# Patient Record
Sex: Female | Born: 1935 | Race: White | Hispanic: No | Smoking: Former smoker
Health system: Southern US, Community
[De-identification: ages and names within clinical notes are randomized; demographics above are authoritative.]

## PROBLEM LIST (undated history)

## (undated) DIAGNOSIS — M1990 Unspecified osteoarthritis, unspecified site: Secondary | ICD-10-CM

## (undated) DIAGNOSIS — J45909 Unspecified asthma, uncomplicated: Secondary | ICD-10-CM

## (undated) DIAGNOSIS — J449 Chronic obstructive pulmonary disease, unspecified: Secondary | ICD-10-CM

## (undated) DIAGNOSIS — N289 Disorder of kidney and ureter, unspecified: Secondary | ICD-10-CM

## (undated) HISTORY — PX: BLADDER SURGERY: SHX569

## (undated) HISTORY — PX: REVISION UROSTOMY CUTANEOUS: SUR1282

## (undated) HISTORY — PX: ABDOMINAL HYSTERECTOMY: SHX81

---

## 2012-10-21 ENCOUNTER — Inpatient Hospital Stay (HOSPITAL_COMMUNITY)
Admission: EM | Admit: 2012-10-21 | Discharge: 2012-10-26 | DRG: 085 | Disposition: A | Discharge status: Rehabilitation Facility | Payer: Medicare Other | Attending: Internal Medicine | Admitting: Internal Medicine

## 2012-10-21 ENCOUNTER — Emergency Department (HOSPITAL_COMMUNITY): Payer: Medicare Other

## 2012-10-21 ENCOUNTER — Encounter (HOSPITAL_COMMUNITY): Payer: Self-pay | Admitting: *Deleted

## 2012-10-21 DIAGNOSIS — Z9181 History of falling: Secondary | ICD-10-CM

## 2012-10-21 DIAGNOSIS — K449 Diaphragmatic hernia without obstruction or gangrene: Secondary | ICD-10-CM | POA: Diagnosis present

## 2012-10-21 DIAGNOSIS — M129 Arthropathy, unspecified: Secondary | ICD-10-CM | POA: Diagnosis present

## 2012-10-21 DIAGNOSIS — IMO0002 Reserved for concepts with insufficient information to code with codable children: Secondary | ICD-10-CM | POA: Diagnosis present

## 2012-10-21 DIAGNOSIS — Q438 Other specified congenital malformations of intestine: Secondary | ICD-10-CM

## 2012-10-21 DIAGNOSIS — Z87891 Personal history of nicotine dependence: Secondary | ICD-10-CM

## 2012-10-21 DIAGNOSIS — G936 Cerebral edema: Secondary | ICD-10-CM

## 2012-10-21 DIAGNOSIS — R634 Abnormal weight loss: Secondary | ICD-10-CM | POA: Diagnosis present

## 2012-10-21 DIAGNOSIS — G9341 Metabolic encephalopathy: Secondary | ICD-10-CM | POA: Diagnosis present

## 2012-10-21 DIAGNOSIS — E871 Hypo-osmolality and hyponatremia: Secondary | ICD-10-CM | POA: Diagnosis present

## 2012-10-21 DIAGNOSIS — R933 Abnormal findings on diagnostic imaging of other parts of digestive tract: Secondary | ICD-10-CM

## 2012-10-21 DIAGNOSIS — W19XXXA Unspecified fall, initial encounter: Secondary | ICD-10-CM | POA: Diagnosis present

## 2012-10-21 DIAGNOSIS — R42 Dizziness and giddiness: Secondary | ICD-10-CM | POA: Diagnosis present

## 2012-10-21 DIAGNOSIS — R948 Abnormal results of function studies of other organs and systems: Secondary | ICD-10-CM | POA: Diagnosis present

## 2012-10-21 DIAGNOSIS — K2100 Gastro-esophageal reflux disease with esophagitis, without bleeding: Secondary | ICD-10-CM | POA: Diagnosis present

## 2012-10-21 DIAGNOSIS — S06369A Traumatic hemorrhage of cerebrum, unspecified, with loss of consciousness of unspecified duration, initial encounter: Secondary | ICD-10-CM

## 2012-10-21 DIAGNOSIS — N35919 Unspecified urethral stricture, male, unspecified site: Secondary | ICD-10-CM | POA: Diagnosis present

## 2012-10-21 DIAGNOSIS — Z936 Other artificial openings of urinary tract status: Secondary | ICD-10-CM

## 2012-10-21 DIAGNOSIS — R269 Unspecified abnormalities of gait and mobility: Secondary | ICD-10-CM | POA: Diagnosis present

## 2012-10-21 DIAGNOSIS — N390 Urinary tract infection, site not specified: Secondary | ICD-10-CM | POA: Diagnosis present

## 2012-10-21 DIAGNOSIS — S065X0A Traumatic subdural hemorrhage without loss of consciousness, initial encounter: Principal | ICD-10-CM | POA: Diagnosis present

## 2012-10-21 DIAGNOSIS — D509 Iron deficiency anemia, unspecified: Secondary | ICD-10-CM | POA: Diagnosis present

## 2012-10-21 DIAGNOSIS — I619 Nontraumatic intracerebral hemorrhage, unspecified: Secondary | ICD-10-CM

## 2012-10-21 DIAGNOSIS — R296 Repeated falls: Secondary | ICD-10-CM

## 2012-10-21 DIAGNOSIS — J4489 Other specified chronic obstructive pulmonary disease: Secondary | ICD-10-CM | POA: Diagnosis present

## 2012-10-21 DIAGNOSIS — G2581 Restless legs syndrome: Secondary | ICD-10-CM

## 2012-10-21 DIAGNOSIS — K319 Disease of stomach and duodenum, unspecified: Secondary | ICD-10-CM

## 2012-10-21 DIAGNOSIS — J449 Chronic obstructive pulmonary disease, unspecified: Secondary | ICD-10-CM | POA: Diagnosis present

## 2012-10-21 DIAGNOSIS — Z79899 Other long term (current) drug therapy: Secondary | ICD-10-CM

## 2012-10-21 HISTORY — DX: Chronic obstructive pulmonary disease, unspecified: J44.9

## 2012-10-21 HISTORY — DX: Unspecified asthma, uncomplicated: J45.909

## 2012-10-21 HISTORY — DX: Unspecified osteoarthritis, unspecified site: M19.90

## 2012-10-21 HISTORY — DX: Disorder of kidney and ureter, unspecified: N28.9

## 2012-10-21 LAB — URINALYSIS, ROUTINE W REFLEX MICROSCOPIC
Bilirubin Urine: NEGATIVE
Glucose, UA: NEGATIVE mg/dL
Ketones, ur: NEGATIVE mg/dL
Specific Gravity, Urine: 1.012 (ref 1.005–1.030)
pH: 6.5 (ref 5.0–8.0)

## 2012-10-21 LAB — COMPREHENSIVE METABOLIC PANEL
ALT: 16 U/L (ref 0–35)
AST: 20 U/L (ref 0–37)
Albumin: 3.3 g/dL — ABNORMAL LOW (ref 3.5–5.2)
Alkaline Phosphatase: 67 U/L (ref 39–117)
Calcium: 9.1 mg/dL (ref 8.4–10.5)
Glucose, Bld: 98 mg/dL (ref 70–99)
Potassium: 4.6 mEq/L (ref 3.5–5.1)
Sodium: 128 mEq/L — ABNORMAL LOW (ref 135–145)
Total Protein: 7 g/dL (ref 6.0–8.3)

## 2012-10-21 LAB — CBC WITH DIFFERENTIAL/PLATELET
Basophils Absolute: 0 10*3/uL (ref 0.0–0.1)
Eosinophils Absolute: 0.3 10*3/uL (ref 0.0–0.7)
Lymphs Abs: 1.7 10*3/uL (ref 0.7–4.0)
MCH: 25.9 pg — ABNORMAL LOW (ref 26.0–34.0)
Neutrophils Relative %: 59 % (ref 43–77)
Platelets: 316 10*3/uL (ref 150–400)
RBC: 4.17 MIL/uL (ref 3.87–5.11)
RDW: 17 % — ABNORMAL HIGH (ref 11.5–15.5)
WBC: 6.9 10*3/uL (ref 4.0–10.5)

## 2012-10-21 LAB — URINE MICROSCOPIC-ADD ON

## 2012-10-21 MED ORDER — DEXTROSE 5 % IV SOLN
1.0000 g | Freq: Once | INTRAVENOUS | Status: AC
  Administered 2012-10-21: 1 g via INTRAVENOUS
  Filled 2012-10-21: qty 10

## 2012-10-21 MED ORDER — GADOBENATE DIMEGLUMINE 529 MG/ML IV SOLN
10.0000 mL | Freq: Once | INTRAVENOUS | Status: AC | PRN
  Administered 2012-10-21: 10 mL via INTRAVENOUS

## 2012-10-21 MED ORDER — SENNOSIDES-DOCUSATE SODIUM 8.6-50 MG PO TABS
1.0000 | ORAL_TABLET | Freq: Two times a day (BID) | ORAL | Status: DC
  Administered 2012-10-21 – 2012-10-26 (×7): 1 via ORAL
  Filled 2012-10-21 (×9): qty 1

## 2012-10-21 MED ORDER — ONDANSETRON HCL 4 MG/2ML IJ SOLN: 4.0000 mg | Freq: Four times a day (QID) | INTRAMUSCULAR | Status: DC | PRN

## 2012-10-21 MED ORDER — PANTOPRAZOLE SODIUM 40 MG IV SOLR
40.0000 mg | Freq: Every day | INTRAVENOUS | Status: DC
  Administered 2012-10-21 – 2012-10-22 (×2): 40 mg via INTRAVENOUS
  Filled 2012-10-21 (×3): qty 40

## 2012-10-21 MED ORDER — ACETAMINOPHEN 325 MG PO TABS
650.0000 mg | ORAL_TABLET | ORAL | Status: DC | PRN
  Administered 2012-10-22 – 2012-10-26 (×8): 650 mg via ORAL
  Filled 2012-10-21 (×8): qty 2

## 2012-10-21 MED ORDER — ACETAMINOPHEN 650 MG RE SUPP: 650.0000 mg | RECTAL | Status: DC | PRN

## 2012-10-21 MED ORDER — SODIUM CHLORIDE 0.9 % IJ SOLN
INTRAMUSCULAR | Status: AC
  Administered 2012-10-22
  Filled 2012-10-21: qty 10

## 2012-10-21 MED ORDER — LABETALOL HCL 5 MG/ML IV SOLN: 10.0000 mg | INTRAVENOUS | Status: DC | PRN

## 2012-10-21 MED ORDER — SODIUM CHLORIDE 0.9 % IV SOLN
INTRAVENOUS | Status: AC
  Administered 2012-10-21: 23:00:00 via INTRAVENOUS

## 2012-10-21 MED ORDER — SODIUM CHLORIDE 0.9 % IV BOLUS (SEPSIS)
1000.0000 mL | Freq: Once | INTRAVENOUS | Status: AC
  Administered 2012-10-21: 1000 mL via INTRAVENOUS

## 2012-10-21 NOTE — ED Notes (Signed)
Report given to [facility], ETA 10 min.  

## 2012-10-21 NOTE — H&P (Signed)
Admission H&P    Chief Complaint: Dizziness and difficulty with gait HPI: Nicole Sparks is an 79 y.o. female who has not been doing well for the past 3 weeks or so.  The patient was diagnosed with a urinary tract infection and has had poor balance for that time.  She has been through multiple antibiotic regimens.  Due to her poor balance during this period of time she has had multiple falls.  Today felt that she was dizzy and had more difficulty with gait.  Called her daughter and was brought in for evaluation at that time.    Date last known well: Unable to determine Time last known well: Unable to determine tPA Given: No: ICH  Past Medical History  Diagnosis Date  . COPD (chronic obstructive pulmonary disease)   . Asthma   . Arthritis   . Renal disorder     Past Surgical History  Procedure Laterality Date  . Revision urostomy cutaneous    . Bladder surgery    . Abdominal hysterectomy      Family history: Mother died of a MI and a stroke.  She had splenic cancer as well.  Father died of lung cancer.  She had two brothers.  One died of cancer and the other died in an airplane accident.    Social History:  reports that she quit smoking about 24 years ago. She has never used smokeless tobacco. She reports that  drinks alcohol. She reports that she does not use illicit drugs.  Allergies:  Allergies  Allergen Reactions  . Sudafed (Pseudoephedrine)     Passes out    Medications Prior to Admission  Medication Sig Dispense Refill  . albuterol (PROVENTIL) (2.5 MG/3ML) 0.083% nebulizer solution Take 2.5 mg by nebulization every 4 (four) hours as needed for wheezing or shortness of breath.      . ALPRAZolam (XANAX) 0.5 MG tablet Take 0.5 mg by mouth at bedtime.      . budesonide-formoterol (SYMBICORT) 80-4.5 MCG/ACT inhaler Inhale 2 puffs into the lungs 2 (two) times daily as needed (for shortnes of breath).      . calcium-vitamin D (OSCAL WITH D) 500-200 MG-UNIT per tablet Take 1  tablet by mouth 2 (two) times daily.      . cetirizine (ZYRTEC) 10 MG tablet Take 10 mg by mouth daily as needed for allergies.      . citalopram (CELEXA) 40 MG tablet Take 40 mg by mouth daily.      . Menthol, Topical Analgesic, (BENGAY EX) Apply 1 application topically at bedtime as needed (for knee/back pain).      . Multiple Vitamin (MULTIVITAMIN WITH MINERALS) TABS Take 1 tablet by mouth daily.      . nitrofurantoin (MACRODANTIN) 100 MG capsule Take 100 mg by mouth 2 (two) times daily. Taking until 10/23/12, then taper down to 1 cap daily.      Marland Kitchen omeprazole (PRILOSEC) 20 MG capsule Take 20 mg by mouth daily as needed (for heartburn).      . traMADol (ULTRAM) 50 MG tablet Take 50 mg by mouth every 4 (four) hours as needed for pain.        ROS: History obtained from the patient  General ROS: negative for - chills, fatigue, fever, night sweats, weight gain or weight loss Psychological ROS: memory difficulties Ophthalmic ROS: negative for - blurry vision, double vision, eye pain or loss of vision ENT ROS: negative for - epistaxis, nasal discharge, oral lesions, sore throat, tinnitus or vertigo  Allergy and Immunology ROS: negative for - hives or itchy/watery eyes Hematological and Lymphatic ROS: negative for - bleeding problems, bruising or swollen lymph nodes Endocrine ROS: negative for - galactorrhea, hair pattern changes, polydipsia/polyuria or temperature intolerance Respiratory ROS: negative for - cough, hemoptysis, shortness of breath or wheezing Cardiovascular ROS: negative for - chest pain, dyspnea on exertion, edema or irregular heartbeat Gastrointestinal ROS: negative for - abdominal pain, diarrhea, hematemesis, nausea/vomiting or stool incontinence Genito-Urinary ROS: urostomy tube in place Musculoskeletal ROS: negative for - joint swelling or muscular weakness Neurological ROS: as noted in HPI Dermatological ROS: negative for rash and skin lesion changes  Physical  Examination: Blood pressure 156/75, pulse 74, temperature 98.6 F (37 C), temperature source Oral, resp. rate 20, height 5\' 2"  (1.575 m), weight 53.524 kg (118 lb), SpO2 100.00%.  General Examination: HEENT-  Normocephalic, no lesions, without obvious abnormality.  Normal external eye and conjunctiva.  Normal TM's bilaterally.  Normal auditory canals and external ears. Normal external nose, mucus membranes and septum.  Normal pharynx. Neck supple with no masses, nodes, nodules or enlargement. Cardiovascular - S1, S2 normal Lungs - chest clear, no wheezing, rales, normal symmetric air entry Abdomen - soft, non-tender; bowel sounds normal; no masses,  no organomegaly. Urostomy clean and without evidence of irritation or infection Extremities - no edema  Neurologic Examination: Mental Status: Alert, oriented, thought content appropriate.  Speech fluent without evidence of aphasia.  Able to follow 3 step commands without difficulty. Cranial Nerves: II: Discs flat bilaterally; Visual fields grossly normal, pupils equal, round, reactive to light and accommodation III,IV, VI: ptosis not present, extra-ocular motions intact bilaterally V,VII: smile symmetric, facial light touch sensation normal bilaterally VIII: hearing normal bilaterally IX,X: gag reflex present XI: bilateral shoulder shrug XII: midline tongue extension Motor: Right : Upper extremity   5/5    Left:     Upper extremity   5/5  Lower extremity   5/5     Lower extremity   5/5 Tone and bulk:normal tone throughout; no atrophy noted.  Positive palmomental. Sensory: Pinprick and light touch intact throughout, bilaterally Deep Tendon Reflexes: 2+ and symmetric throughout Plantars: Right: upgoing   Left: upgoing Cerebellar: normal finger-to-nose and normal heel-to-shin test Gait: Unable to test CV: pulses palpable throughout   Laboratory Studies:   Basic Metabolic Panel:  Recent Labs Lab 10/21/12 1745  NA 128*  K 4.6  CL  94*  CO2 26  GLUCOSE 98  BUN 17  CREATININE 1.02  CALCIUM 9.1    Liver Function Tests:  Recent Labs Lab 10/21/12 1745  AST 20  ALT 16  ALKPHOS 67  BILITOT 0.1*  PROT 7.0  ALBUMIN 3.3*   No results found for this basename: LIPASE, AMYLASE,  in the last 168 hours No results found for this basename: AMMONIA,  in the last 168 hours  CBC:  Recent Labs Lab 10/21/12 1745  WBC 6.9  NEUTROABS 4.1  HGB 10.8*  HCT 33.9*  MCV 81.3  PLT 316    Cardiac Enzymes: No results found for this basename: CKTOTAL, CKMB, CKMBINDEX, TROPONINI,  in the last 168 hours  BNP: No components found with this basename: POCBNP,   CBG: No results found for this basename: GLUCAP,  in the last 168 hours  Microbiology: No results found for this or any previous visit.  Coagulation Studies: No results found for this basename: LABPROT, INR,  in the last 72 hours  Urinalysis:  Recent Labs Lab 10/21/12 1811  COLORURINE YELLOW  LABSPEC 1.012  PHURINE 6.5  GLUCOSEU NEGATIVE  HGBUR SMALL*  BILIRUBINUR NEGATIVE  KETONESUR NEGATIVE  PROTEINUR NEGATIVE  UROBILINOGEN 0.2  NITRITE POSITIVE*  LEUKOCYTESUR NEGATIVE    Lipid Panel:  No results found for this basename: chol, trig, hdl, cholhdl, vldl, ldlcalc    HgbA1C:  No results found for this basename: HGBA1C    Urine Drug Screen:   No results found for this basename: labopia, cocainscrnur, labbenz, amphetmu, thcu, labbarb    Alcohol Level: No results found for this basename: ETH,  in the last 168 hours  Imaging: Ct Head Wo Contrast  10/21/2012   *RADIOLOGY REPORT*  Clinical Data: Weakness.  UTI  CT HEAD WITHOUT CONTRAST  Technique:  Contiguous axial images were obtained from the base of the skull through the vertex without contrast.  Comparison: None  Findings: Generalized atrophy with prominent ventricles and subarachnoid space.  Chronic microvascular ischemic changes in the white matter.  15 mm lesion in the high right parietal lobe  is mildly hyper dense compared to brain.  There is surrounding vasogenic edema.  Negative for subarachnoid or subdural hemorrhage.  No shift to the midline structures.  IMPRESSION: Hyperdense 15 mm lesion right high parietal lobe with surrounding edema.  This may represent a metastatic disease containing acute hemorrhage.  This could be a hemorrhagic infarction or possibly an area resolving subacute hemorrhage.  Overall I would favor hemorrhagic metastatic disease.  Further evaluation with MRI brain without and with contrast is suggested.  I discussed the findings by telephone with Dr. Konrad Dolores   Original Report Authenticated By: Janeece Riggers, M.D.   Mr Laqueta Jean ZH Contrast  10/21/2012   *RADIOLOGY REPORT*  Clinical Data: Weakness.  Abnormal head CT.  MRI HEAD WITHOUT AND WITH CONTRAST  Technique:  Multiplanar, multiecho pulse sequences of the brain and surrounding structures were obtained according to standard protocol without and with intravenous contrast  Contrast: 10mL MULTIHANCE GADOBENATE DIMEGLUMINE 529 MG/ML IV SOLN  Comparison: Head CT same day  Findings: There is a 3.8 x 2.2 x 2.8 cm in diameter late subacute intraparenchymal hematoma in the medial right parietal lobe corresponding to the CT abnormality.  There is typical marginal enhancement.  There is some regional vasogenic edema.  There is a small amount of subdural blood along the right side of the posterior falx, no more than 1 mm in thickness.  There are chronic small vessel changes affecting the pons.  There is cerebellar atrophy but no focal cerebellar insult.  There are dilated perivascular spaces throughout the cerebral hemispheres. There are mild chronic small vessel changes within the deep white matter.  No evidence of neoplastic mass lesion.  No sign of acute hemorrhage.  No hydrocephalus.  No pituitary lesion.  No skull or skull base lesion.  IMPRESSION: The abnormality at CT represents a late subacute intraparenchymal hematoma.  There is  mild regional vasogenic edema but no mass effect of significance.  Very thin amount of subdural blood along the right posterior falx.  The findings could be post-traumatic or they could represent an intraparenchymal hemorrhage which penetrated into the subdural space with the underlying etiology been either hypertension or amyloid angiopathy.  Atrophy and chronic small vessel changes elsewhere throughout the brain.   Original Report Authenticated By: Paulina Fusi, M.D.    Assessment: 79 y.o. female presenting with difficulty with gait and dizziness that has increased over the past 3 weeks.  Head CT has been reviewed and shows a hyperdense area in the high  right parietal lobe.  MRI of the brain has been reviewed as well and shows the hemorrhage to be late subacute.  Nothing acute on imaging to explain patient's worsening today.  Although has been falling can not rule out other etiologies as well.    Stroke Risk Factors - none  Plan: 1. HgbA1c, fasting lipid panel 2. PT consult, OT consult 3. Echocardiogram 4. Carotid dopplers 5. Prophylactic therapy-None 6. Risk factor modification 7. Telemetry monitoring 8. Frequent neuro checks 9. On Rocephin for infection   Thana Farr, MD [facility] [phone] 10/21/2012, 11:19 PM

## 2012-10-21 NOTE — ED Provider Notes (Signed)
History    This chart was scribed for non-physician practitioner Junious Silk PA-C working with [name], MD by Smitty Pluck, ED scribe. This patient was seen in room WA18/WA18 and the patient's care was started at 4:58 PM.   CSN: [id number]  Arrival date & time 10/21/12  1621      Chief Complaint  Patient presents with  . Weakness  . Urinary Tract Infection     The history is provided by the patient, medical records and a relative. No language interpreter was used.   HPI Comments: Nicole Sparks is a 79 y.o. female with hx of asthma, COPD and renal disorder who presents to the Emergency Department complaining of moderate generalized weakness and light headedness that has been ongoing for past 3 months and worsening 3 weeks ago.  Family reports that pt was started on Macrodantin 5 days ago for UTI after no success using Cipro and Keflex. Family state that pt has persistent UTI over the past 3 weeks. Pt reports the light headedness is aggravated by moving her head. She reports having 4 falls within past 3 months. Her last fall was 3 weeks ago. Family states that she has some mild confusion that has gradually gotten worse over the past 3 weeks. Pt denies fever, chills, nausea, vomiting, diarrhea, weakness, cough, SOB and any other pain. Pt has urostomy.    Past Medical History  Diagnosis Date  . COPD (chronic obstructive pulmonary disease)   . Asthma   . Arthritis   . Renal disorder     Past Surgical History  Procedure Laterality Date  . Revision urostomy cutaneous    . Bladder surgery    . Abdominal hysterectomy      History reviewed. No pertinent family history.  History  Substance Use Topics  . Smoking status: Former Smoker    Quit date: 10/21/1988  . Smokeless tobacco: Never Used  . Alcohol Use: 0.0 oz/week    0 Glasses of wine per week     Comment: daily    OB History   Grav Para Term Preterm Abortions TAB SAB Ect Mult Living                  Review of  Systems  Constitutional: Negative for fever and chills.  Respiratory: Negative for shortness of breath.   Gastrointestinal: Negative for nausea and vomiting.  Neurological: Positive for speech difficulty, weakness and light-headedness.  Psychiatric/Behavioral: Positive for confusion.  All other systems reviewed and are negative.    Allergies  Sudafed  Home Medications   Current Outpatient Rx  Name  Route  Sig  Dispense  Refill  . albuterol (PROVENTIL) (2.5 MG/3ML) 0.083% nebulizer solution   Nebulization   Take 2.5 mg by nebulization every 4 (four) hours as needed for wheezing or shortness of breath.         . ALPRAZolam (XANAX) 0.5 MG tablet   Oral   Take 0.5 mg by mouth at bedtime.         . budesonide-formoterol (SYMBICORT) 80-4.5 MCG/ACT inhaler   Inhalation   Inhale 2 puffs into the lungs 2 (two) times daily as needed (for shortnes of breath).         . calcium-vitamin D (OSCAL WITH D) 500-200 MG-UNIT per tablet   Oral   Take 1 tablet by mouth 2 (two) times daily.         . cetirizine (ZYRTEC) 10 MG tablet   Oral   Take 10 mg by  mouth daily as needed for allergies.         . citalopram (CELEXA) 40 MG tablet   Oral   Take 40 mg by mouth daily.         . Menthol, Topical Analgesic, (BENGAY EX)   Topical   Apply 1 application topically at bedtime as needed (for knee/back pain).         . Multiple Vitamin (MULTIVITAMIN WITH MINERALS) TABS   Oral   Take 1 tablet by mouth daily.         . nitrofurantoin (MACRODANTIN) 100 MG capsule   Oral   Take 100 mg by mouth 2 (two) times daily. Taking until 10/23/12, then taper down to 1 cap daily.         Marland Kitchen omeprazole (PRILOSEC) 20 MG capsule   Oral   Take 20 mg by mouth daily as needed (for heartburn).         . traMADol (ULTRAM) 50 MG tablet   Oral   Take 50 mg by mouth every 4 (four) hours as needed for pain.           BP 147/87  Pulse 80  Temp(Src) 98.8 F (37.1 C) (Oral)  Resp 18  Ht 5'  2" (1.575 m)  Wt 118 lb (53.524 kg)  BMI 21.58 kg/m2  SpO2 97%  Physical Exam  Nursing note and vitals reviewed. Constitutional: She is oriented to person, place, and time. She appears well-developed and well-nourished. No distress.  HENT:  Head: Normocephalic and atraumatic.  Right Ear: External ear normal.  Left Ear: External ear normal.  Nose: Nose normal.  Mouth/Throat: Oropharynx is clear and moist.  Eyes: Conjunctivae are normal.  Neck: Normal range of motion.  Cardiovascular: Normal rate, regular rhythm and normal heart sounds.   Pulmonary/Chest: Effort normal and breath sounds normal. No stridor. No respiratory distress. She has no wheezes. She has no rales.  Abdominal: Soft. She exhibits no distension.  Urostomy bag without signs of infection   Musculoskeletal: Normal range of motion.  Neurological: She is alert and oriented to person, place, and time. She has normal strength.  No focal neuro deficits    Skin: Skin is warm and dry. She is not diaphoretic. No erythema.  Psychiatric: She has a normal mood and affect. Her behavior is normal.    ED Course  Procedures (including critical care time) DIAGNOSTIC STUDIES: Oxygen Saturation is 97% on room air, normal by my interpretation.    COORDINATION OF CARE: 5:06 PM Discussed ED treatment with pt and pt agrees.     Labs Reviewed  CBC WITH DIFFERENTIAL - Abnormal; Notable for the following:    Hemoglobin 10.8 (*)    HCT 33.9 (*)    MCH 25.9 (*)    RDW 17.0 (*)    All other components within normal limits  COMPREHENSIVE METABOLIC PANEL - Abnormal; Notable for the following:    Sodium 128 (*)    Chloride 94 (*)    Albumin 3.3 (*)    Total Bilirubin 0.1 (*)    GFR calc non Af Amer 51 (*)    GFR calc Af Amer 59 (*)    All other components within normal limits  URINALYSIS, ROUTINE W REFLEX MICROSCOPIC - Abnormal; Notable for the following:    APPearance CLOUDY (*)    Hgb urine dipstick SMALL (*)    Nitrite  POSITIVE (*)    All other components within normal limits  URINE MICROSCOPIC-ADD ON - Abnormal; Notable  for the following:    Bacteria, UA MANY (*)    All other components within normal limits   Ct Head Wo Contrast  10/21/2012   *RADIOLOGY REPORT*  Clinical Data: Weakness.  UTI  CT HEAD WITHOUT CONTRAST  Technique:  Contiguous axial images were obtained from the base of the skull through the vertex without contrast.  Comparison: None  Findings: Generalized atrophy with prominent ventricles and subarachnoid space.  Chronic microvascular ischemic changes in the white matter.  15 mm lesion in the high right parietal lobe is mildly hyper dense compared to brain.  There is surrounding vasogenic edema.  Negative for subarachnoid or subdural hemorrhage.  No shift to the midline structures.  IMPRESSION: Hyperdense 15 mm lesion right high parietal lobe with surrounding edema.  This may represent a metastatic disease containing acute hemorrhage.  This could be a hemorrhagic infarction or possibly an area resolving subacute hemorrhage.  Overall I would favor hemorrhagic metastatic disease.  Further evaluation with MRI brain without and with contrast is suggested.  I discussed the findings by telephone with Dr. Konrad Dolores   Original Report Authenticated By: Janeece Riggers, M.D.   Mr Laqueta Jean [facility] Contrast  10/21/2012   *RADIOLOGY REPORT*  Clinical Data: Weakness.  Abnormal head CT.  MRI HEAD WITHOUT AND WITH CONTRAST  Technique:  Multiplanar, multiecho pulse sequences of the brain and surrounding structures were obtained according to standard protocol without and with intravenous contrast  Contrast: 10mL MULTIHANCE GADOBENATE DIMEGLUMINE 529 MG/ML IV SOLN  Comparison: Head CT same day  Findings: There is a 3.8 x 2.2 x 2.8 cm in diameter late subacute intraparenchymal hematoma in the medial right parietal lobe corresponding to the CT abnormality.  There is typical marginal enhancement.  There is some regional vasogenic edema.   There is a small amount of subdural blood along the right side of the posterior falx, no more than 1 mm in thickness.  There are chronic small vessel changes affecting the pons.  There is cerebellar atrophy but no focal cerebellar insult.  There are dilated perivascular spaces throughout the cerebral hemispheres. There are mild chronic small vessel changes within the deep white matter.  No evidence of neoplastic mass lesion.  No sign of acute hemorrhage.  No hydrocephalus.  No pituitary lesion.  No skull or skull base lesion.  IMPRESSION: The abnormality at CT represents a late subacute intraparenchymal hematoma.  There is mild regional vasogenic edema but no mass effect of significance.  Very thin amount of subdural blood along the right posterior falx.  The findings could be post-traumatic or they could represent an intraparenchymal hemorrhage which penetrated into the subdural space with the underlying etiology been either hypertension or amyloid angiopathy.  Atrophy and chronic small vessel changes elsewhere throughout the brain.   Original Report Authenticated By: Paulina Fusi, M.D.     1. Intraparenchymal hemorrhage of brain   2. Cytotoxic brain edema       MDM  Patient is a pleasant 79 year old female who presents today with worsening weakness over the past 3 weeks. She has had any UTI since September. She has been started on antibiotics for this. Despite antibiotic therapy, her urine came back positive for a urinary tract infection. Antibiotics were started for UTI. CT of the head showed a lesion in her high parietal lobe with surrounding edema. MRI brain with and without contrast was ordered and neurology was consulted. Neurology was made aware of the results of the MRI brain. [facility] transferred to  [facility] and admitted to the neurology service. Dr. [name] was involved in care and agrees with plan. Vital signs stable for transfer. Patient / Family / Caregiver informed of clinical course, understand  medical decision-making process, and agree with plan.     I personally performed the services described in this documentation, which was scribed in my presence. The recorded information has been reviewed and is accurate.      Mora Bellman, PA-C 10/22/12 1526

## 2012-10-21 NOTE — ED Notes (Signed)
Pt to MRI via stretcher.

## 2012-10-21 NOTE — ED Notes (Signed)
Pt from home with reports of being diagnosed with UTI about a week ago and to spite taking Cipro, Keflex and Macrodantin continues to get weaker. Pt also reports diarrhea and nausea that started today.

## 2012-10-21 NOTE — ED Notes (Signed)
Report given to [facility] on 3300

## 2012-10-22 ENCOUNTER — Encounter (HOSPITAL_COMMUNITY): Payer: Self-pay | Admitting: Radiology

## 2012-10-22 ENCOUNTER — Inpatient Hospital Stay (HOSPITAL_COMMUNITY): Payer: Medicare Other

## 2012-10-22 DIAGNOSIS — G2581 Restless legs syndrome: Secondary | ICD-10-CM

## 2012-10-22 DIAGNOSIS — G9341 Metabolic encephalopathy: Secondary | ICD-10-CM | POA: Diagnosis present

## 2012-10-22 DIAGNOSIS — N390 Urinary tract infection, site not specified: Secondary | ICD-10-CM | POA: Diagnosis present

## 2012-10-22 DIAGNOSIS — I619 Nontraumatic intracerebral hemorrhage, unspecified: Secondary | ICD-10-CM

## 2012-10-22 DIAGNOSIS — S06369A Traumatic hemorrhage of cerebrum, unspecified, with loss of consciousness of unspecified duration, initial encounter: Secondary | ICD-10-CM

## 2012-10-22 DIAGNOSIS — IMO0002 Reserved for concepts with insufficient information to code with codable children: Secondary | ICD-10-CM | POA: Diagnosis present

## 2012-10-22 DIAGNOSIS — R296 Repeated falls: Secondary | ICD-10-CM

## 2012-10-22 DIAGNOSIS — E871 Hypo-osmolality and hyponatremia: Secondary | ICD-10-CM

## 2012-10-22 DIAGNOSIS — S0633AA Contusion and laceration of cerebrum, unspecified, with loss of consciousness status unknown, initial encounter: Secondary | ICD-10-CM

## 2012-10-22 LAB — IRON AND TIBC: Iron: 25 ug/dL — ABNORMAL LOW (ref 42–135)

## 2012-10-22 LAB — MRSA PCR SCREENING: MRSA by PCR: POSITIVE — AB

## 2012-10-22 LAB — TSH: TSH: 0.709 u[IU]/mL (ref 0.350–4.500)

## 2012-10-22 LAB — RETICULOCYTES
RBC.: 4.1 MIL/uL (ref 3.87–5.11)
Retic Count, Absolute: 24.6 10*3/uL (ref 19.0–186.0)

## 2012-10-22 LAB — FERRITIN: Ferritin: 21 ng/mL (ref 10–291)

## 2012-10-22 MED ORDER — MUPIROCIN 2 % EX OINT
1.0000 "application " | TOPICAL_OINTMENT | Freq: Two times a day (BID) | CUTANEOUS | Status: DC
  Administered 2012-10-22 – 2012-10-26 (×8): 1 via NASAL
  Filled 2012-10-22 (×2): qty 22

## 2012-10-22 MED ORDER — ENSURE COMPLETE PO LIQD
237.0000 mL | Freq: Two times a day (BID) | ORAL | Status: DC
  Administered 2012-10-22 – 2012-10-26 (×5): 237 mL via ORAL

## 2012-10-22 MED ORDER — CHLORHEXIDINE GLUCONATE CLOTH 2 % EX PADS
6.0000 | MEDICATED_PAD | Freq: Every day | CUTANEOUS | Status: DC
  Administered 2012-10-22 – 2012-10-26 (×4): 6 via TOPICAL

## 2012-10-22 MED ORDER — IOHEXOL 300 MG/ML  SOLN
25.0000 mL | INTRAMUSCULAR | Status: AC
  Administered 2012-10-22: 25 mL via ORAL

## 2012-10-22 MED ORDER — TRAMADOL HCL 50 MG PO TABS
50.0000 mg | ORAL_TABLET | ORAL | Status: DC | PRN
  Administered 2012-10-22 – 2012-10-26 (×10): 50 mg via ORAL
  Filled 2012-10-22 (×10): qty 1

## 2012-10-22 MED ORDER — CITALOPRAM HYDROBROMIDE 40 MG PO TABS
40.0000 mg | ORAL_TABLET | Freq: Every day | ORAL | Status: DC
  Administered 2012-10-22 – 2012-10-26 (×4): 40 mg via ORAL
  Filled 2012-10-22 (×5): qty 1

## 2012-10-22 MED ORDER — IOHEXOL 300 MG/ML  SOLN
80.0000 mL | Freq: Once | INTRAMUSCULAR | Status: AC | PRN
  Administered 2012-10-22: 80 mL via INTRAVENOUS

## 2012-10-22 NOTE — Consult Note (Signed)
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 right handed to female with history of COPD. By report patient not doing well for the past 3 weeks with multiple falls and poor balance as well as reported 45 pound weight loss over the last 5 months.. Presented 10/21/2012 with dizziness and more difficulty with gait. MRI of the brain shows late subacute intraparenchymal hematoma. Patient did not receive TPA. Neurology services consulted with full workup ongoing. CT of chest and abdomen are pending. Contact precautions for positive MRSA nasal nares. Physical therapy evaluation completed 10/22/2012 with recommendations of physical medicine rehabilitation consult to consider inpatient rehabilitation services   Review of Systems  Constitutional: Positive for weight loss.  Respiratory: Positive for cough.   Gastrointestinal:       Reflux  Musculoskeletal: Positive for joint pain.  Neurological: Positive for weakness.  Psychiatric/Behavioral: Positive for depression.       Anxiety  All other systems reviewed and are negative.   Past Medical History  Diagnosis Date  . COPD (chronic obstructive pulmonary disease)   . Asthma   . Arthritis   . Renal disorder    Past Surgical History  Procedure Laterality Date  . Revision urostomy cutaneous    . Bladder surgery    . Abdominal hysterectomy     History reviewed. No pertinent family history. Social History:  reports that she quit smoking about 24 years ago. She has never used smokeless tobacco. She reports that  drinks alcohol. She reports that she does not use illicit drugs. Allergies:  Allergies  Allergen Reactions  . Sudafed (Pseudoephedrine)     Passes out   Medications Prior to Admission  Medication Sig Dispense Refill  . albuterol (PROVENTIL) (2.5 MG/3ML) 0.083% nebulizer solution Take 2.5 mg  by nebulization every 4 (four) hours as needed for wheezing or shortness of breath.      . ALPRAZolam (XANAX) 0.5 MG tablet Take 0.5 mg by mouth at bedtime.      . budesonide-formoterol (SYMBICORT) 80-4.5 MCG/ACT inhaler Inhale 2 puffs into the lungs 2 (two) times daily as needed (for shortnes of breath).      . calcium-vitamin D (OSCAL WITH D) 500-200 MG-UNIT per tablet Take 1 tablet by mouth 2 (two) times daily.      . cetirizine (ZYRTEC) 10 MG tablet Take 10 mg by mouth daily as needed for allergies.      . citalopram (CELEXA) 40 MG tablet Take 40 mg by mouth daily.      . Menthol, Topical Analgesic, (BENGAY EX) Apply 1 application topically at bedtime as needed (for knee/back pain).      . Multiple Vitamin (MULTIVITAMIN WITH MINERALS) TABS Take 1 tablet by mouth daily.      . nitrofurantoin (MACRODANTIN) 100 MG capsule Take 100 mg by mouth 2 (two) times daily. Taking until 10/23/12, then taper down to 1 cap daily.      Marland Kitchen omeprazole (PRILOSEC) 20 MG capsule Take 20 mg by mouth daily as needed (for heartburn).      . traMADol (ULTRAM) 50 MG tablet Take 50 mg by mouth every 4 (four) hours as needed for pain.        Home: Home Living Lives With: Daughter Available Help at Discharge: Family;Available PRN/intermittently Type of Home: House Home Access: Stairs to enter [employer] of Steps: 4-5 Entrance Stairs-Rails: Right Home Layout: One level Bathroom Shower/Tub: Other (  comment) (Pt takes sponge bath due to ostomy.) Bathroom Toilet: Standard Home Adaptive Equipment: None  Functional History: Prior Function Able to Take Stairs?: Yes Vocation: Retired Functional Status:  Mobility: Bed Mobility Bed Mobility: Supine to Sit;Sitting - Scoot to Edge of Bed;Sit to Supine Supine to Sit: 4: Min assist;HOB elevated Sitting - Scoot to Edge of Bed: 5: Supervision Sit to Supine: 4: Min assist;HOB flat Transfers Transfers: Sit to Stand;Stand to Sit Sit to Stand: 4: Min assist;With  upper extremity assist;With armrests;From bed;From chair/3-in-1;From toilet Stand to Sit: 4: Min assist;With upper extremity assist;With armrests;To bed;To chair/3-in-1;To toilet Ambulation/Gait Ambulation/Gait Assistance: 3: Mod assist;4: Min assist Ambulation Distance (Feet): 125 Feet Assistive device: Rolling walker;1 person hand held assist Ambulation/Gait Assistance Details: Pt required mod A with hand-held and pt reaching and grasping objects with her free hand. Pt improved with walker but still ataxic and unsteady. Gait Pattern: Step-through pattern;Decreased step length - right;Decreased step length - left;Ataxic;Narrow base of support Gait velocity: decr    ADL:    Cognition: Cognition Overall Cognitive Status: Impaired/Different from baseline Arousal/Alertness: Awake/alert Orientation Level: Oriented X4 Cognition Arousal/Alertness: Awake/alert Behavior During Therapy: WFL for tasks assessed/performed Overall Cognitive Status: Impaired/Different from baseline General Comments: Pt demonstrated lt inattention.  Pt also became easily flustered.  Blood pressure 128/60, pulse 75, temperature 98.2 F (36.8 C), temperature source Oral, resp. rate 16, height 5\' 2"  (1.575 m), weight 53.7 kg (118 lb 6.2 oz), SpO2 98.00%. Physical Exam  Vitals reviewed. Constitutional: She is oriented to person, place, and time. She appears well-developed and well-nourished. No distress.  HENT:  Head: Normocephalic and atraumatic.  Right Ear: External ear normal.  Left Ear: External ear normal.  Eyes: Conjunctivae and EOM are normal. Pupils are equal, round, and reactive to light. Right eye exhibits no discharge. Left eye exhibits no discharge.  Neck: Normal range of motion. Neck supple. No JVD present. No tracheal deviation present. No thyromegaly present.  Cardiovascular: Normal rate and regular rhythm.  Exam reveals no friction rub.   No murmur heard. Pulmonary/Chest: Effort normal and breath  sounds normal. No respiratory distress. She has no wheezes. She has no rales. She exhibits no tenderness.  Abdominal: Soft. Bowel sounds are normal. She exhibits no distension. There is no tenderness. There is no rebound.  Musculoskeletal: She exhibits no edema.  Lymphadenopathy:    She has no cervical adenopathy.  Neurological: She is alert and oriented to person, place, and time.  Mild left sided weakness (4 to 4+). Left PN and decreased FMC on the left side. Sensation grossly intact. No CN findings. Cognitively had reasonable insight and awareness.  Skin: Skin is dry.  Psychiatric: She has a normal mood and affect. Her behavior is normal. Judgment and thought content normal.    Results for orders placed during the hospital encounter of 10/21/12 (from the past 24 hour(s))  CBC WITH DIFFERENTIAL     Status: Abnormal   Collection Time    10/21/12  5:45 PM      Result Value Range   WBC 6.9  4.0 - 10.5 K/uL   RBC 4.17  3.87 - 5.11 MIL/uL   Hemoglobin 10.8 (*) 12.0 - 15.0 g/dL   HCT 16.1 (*) 09.6 - 04.5 %   MCV 81.3  78.0 - 100.0 fL   MCH 25.9 (*) 26.0 - 34.0 pg   MCHC 31.9  30.0 - 36.0 g/dL   RDW 40.9 (*) 81.1 - 91.4 %   Platelets 316  150 - 400  K/uL   Neutrophils Relative % 59  43 - 77 %   Neutro Abs 4.1  1.7 - 7.7 K/uL   Lymphocytes Relative 25  12 - 46 %   Lymphs Abs 1.7  0.7 - 4.0 K/uL   Monocytes Relative 11  3 - 12 %   Monocytes Absolute 0.8  0.1 - 1.0 K/uL   Eosinophils Relative 4  0 - 5 %   Eosinophils Absolute 0.3  0.0 - 0.7 K/uL   Basophils Relative 0  0 - 1 %   Basophils Absolute 0.0  0.0 - 0.1 K/uL  COMPREHENSIVE METABOLIC PANEL     Status: Abnormal   Collection Time    10/21/12  5:45 PM      Result Value Range   Sodium 128 (*) 135 - 145 mEq/L   Potassium 4.6  3.5 - 5.1 mEq/L   Chloride 94 (*) 96 - 112 mEq/L   CO2 26  19 - 32 mEq/L   Glucose, Bld 98  70 - 99 mg/dL   BUN 17  6 - 23 mg/dL   Creatinine, Ser 1.61  0.50 - 1.10 mg/dL   Calcium 9.1  8.4 - 09.6 mg/dL    Total Protein 7.0  6.0 - 8.3 g/dL   Albumin 3.3 (*) 3.5 - 5.2 g/dL   AST 20  0 - 37 U/L   ALT 16  0 - 35 U/L   Alkaline Phosphatase 67  39 - 117 U/L   Total Bilirubin 0.1 (*) 0.3 - 1.2 mg/dL   GFR calc non Af Amer 51 (*) >90 mL/min   GFR calc Af Amer 59 (*) >90 mL/min  URINALYSIS, ROUTINE W REFLEX MICROSCOPIC     Status: Abnormal   Collection Time    10/21/12  6:11 PM      Result Value Range   Color, Urine YELLOW  YELLOW   APPearance CLOUDY (*) CLEAR   Specific Gravity, Urine 1.012  1.005 - 1.030   pH 6.5  5.0 - 8.0   Glucose, UA NEGATIVE  NEGATIVE mg/dL   Hgb urine dipstick SMALL (*) NEGATIVE   Bilirubin Urine NEGATIVE  NEGATIVE   Ketones, ur NEGATIVE  NEGATIVE mg/dL   Protein, ur NEGATIVE  NEGATIVE mg/dL   Urobilinogen, UA 0.2  0.0 - 1.0 mg/dL   Nitrite POSITIVE (*) NEGATIVE   Leukocytes, UA NEGATIVE  NEGATIVE  URINE MICROSCOPIC-ADD ON     Status: Abnormal   Collection Time    10/21/12  6:11 PM      Result Value Range   RBC / HPF 3-6  <3 RBC/hpf   Bacteria, UA MANY (*) RARE  MRSA PCR SCREENING     Status: Abnormal   Collection Time    10/21/12 10:52 PM      Result Value Range   MRSA by PCR POSITIVE (*) NEGATIVE  SEDIMENTATION RATE     Status: None   Collection Time    10/22/12 10:10 AM      Result Value Range   Sed Rate 10  0 - 22 mm/hr   Ct Head Wo Contrast  10/21/2012   *RADIOLOGY REPORT*  Clinical Data: Weakness.  UTI  CT HEAD WITHOUT CONTRAST  Technique:  Contiguous axial images were obtained from the base of the skull through the vertex without contrast.  Comparison: None  Findings: Generalized atrophy with prominent ventricles and subarachnoid space.  Chronic microvascular ischemic changes in the white matter.  15 mm lesion in the high right parietal lobe  is mildly hyper dense compared to brain.  There is surrounding vasogenic edema.  Negative for subarachnoid or subdural hemorrhage.  No shift to the midline structures.  IMPRESSION: Hyperdense 15 mm lesion right  high parietal lobe with surrounding edema.  This may represent a metastatic disease containing acute hemorrhage.  This could be a hemorrhagic infarction or possibly an area resolving subacute hemorrhage.  Overall I would favor hemorrhagic metastatic disease.  Further evaluation with MRI brain without and with contrast is suggested.  I discussed the findings by telephone with Dr. Konrad Dolores   Original Report Authenticated By: Janeece Riggers, M.D.   Mr Laqueta Jean HY Contrast  10/21/2012   *RADIOLOGY REPORT*  Clinical Data: Weakness.  Abnormal head CT.  MRI HEAD WITHOUT AND WITH CONTRAST  Technique:  Multiplanar, multiecho pulse sequences of the brain and surrounding structures were obtained according to standard protocol without and with intravenous contrast  Contrast: 10mL MULTIHANCE GADOBENATE DIMEGLUMINE 529 MG/ML IV SOLN  Comparison: Head CT same day  Findings: There is a 3.8 x 2.2 x 2.8 cm in diameter late subacute intraparenchymal hematoma in the medial right parietal lobe corresponding to the CT abnormality.  There is typical marginal enhancement.  There is some regional vasogenic edema.  There is a small amount of subdural blood along the right side of the posterior falx, no more than 1 mm in thickness.  There are chronic small vessel changes affecting the pons.  There is cerebellar atrophy but no focal cerebellar insult.  There are dilated perivascular spaces throughout the cerebral hemispheres. There are mild chronic small vessel changes within the deep white matter.  No evidence of neoplastic mass lesion.  No sign of acute hemorrhage.  No hydrocephalus.  No pituitary lesion.  No skull or skull base lesion.  IMPRESSION: The abnormality at CT represents a late subacute intraparenchymal hematoma.  There is mild regional vasogenic edema but no mass effect of significance.  Very thin amount of subdural blood along the right posterior falx.  The findings could be post-traumatic or they could represent an  intraparenchymal hemorrhage which penetrated into the subdural space with the underlying etiology been either hypertension or amyloid angiopathy.  Atrophy and chronic small vessel changes elsewhere throughout the brain.   Original Report Authenticated By: Paulina Fusi, M.D.    Assessment/Plan: Diagnosis: Right parietal intraparenchymal hemorrhage, small SDH (likely traumatic) 1. Does the need for close, 24 hr/day medical supervision in concert with the patient's rehab needs make it unreasonable for this patient to be served in a less intensive setting? Yes 2. Co-Morbidities requiring supervision/potential complications: hyponatremia, gait disorder, FTT 3. Due to bladder management, bowel management, safety, skin/wound care, disease management, medication administration and patient education, does the patient require 24 hr/day rehab nursing? Yes 4. Does the patient require coordinated care of a physician, rehab nurse, PT (1-2 hrs/day, 5 days/week) and OT (1-2 hrs/day, 5 days/week) to address physical and functional deficits in the context of the above medical diagnosis(es)? Yes Addressing deficits in the following areas: balance, endurance, locomotion, strength, transferring, bowel/bladder control, bathing, dressing, feeding, grooming, toileting and psychosocial support 5. Can the patient actively participate in an intensive therapy program of at least 3 hrs of therapy per day at least 5 days per week? Yes 6. The potential for patient to make measurable gains while on inpatient rehab is good 7. Anticipated functional outcomes upon discharge from inpatient rehab are supervision to mod I with PT, supervision to mod I with OT, n/a with SLP. 8.  Estimated rehab length of stay to reach the above functional goals is: 7-10 days 9. Does the patient have adequate social supports to accommodate these discharge functional goals? Potentially 10. Anticipated D/C setting: Home 11. Anticipated post D/C treatments: HH  therapy 12. Overall Rehab/Functional Prognosis: excellent  RECOMMENDATIONS: This patient's condition is appropriate for continued rehabilitative care in the following setting: CIR Patient has agreed to participate in recommended program. Yes Note that insurance prior authorization may be required for reimbursement for recommended care.  Comment: Rehab RN to follow up.   Ranelle Oyster, MD, [place] [facility]     10/22/2012

## 2012-10-22 NOTE — Evaluation (Signed)
Physical Therapy Evaluation Patient Details Name: Nicole Sparks MRN: [id number] DOB: 1931-11-03 Today's Date: 10/22/2012 Time: 4098-1191 PT Time Calculation (min): 42 min  PT Assessment / Plan / Recommendation Clinical Impression    Pt admitted with rt parietal subacute ICH. Pt currently with functional limitations due to the deficits listed below (PT Problem List). Pt will benefit from skilled PT to increase their independence and safety with mobility to allow discharge to CIR.      PT Assessment  Patient needs continued PT services    Follow Up Recommendations  CIR    Does the patient have the potential to tolerate intense rehabilitation      Barriers to Discharge        Equipment Recommendations  Rolling walker with 5" wheels    Recommendations for Other Services     Frequency Min 4X/week    Precautions / Restrictions Precautions Precautions: Fall   Pertinent Vitals/Pain See flow sheet.      Mobility  Bed Mobility Bed Mobility: Supine to Sit;Sitting - Scoot to Edge of Bed;Sit to Supine Supine to Sit: 4: Min assist;HOB elevated Sitting - Scoot to Edge of Bed: 5: Supervision Sit to Supine: 4: Min assist;HOB flat Transfers Transfers: Sit to Stand;Stand to Sit Sit to Stand: 4: Min assist;With upper extremity assist;With armrests;From bed;From chair/3-in-1;From toilet Stand to Sit: 4: Min assist;With upper extremity assist;With armrests;To bed;To chair/3-in-1;To toilet Details for Transfer Assistance: Pt needed a lot of guidance/ cues when going to sit on commode Ambulation/Gait Ambulation/Gait Assistance: 3: Mod assist;4: Min assist Ambulation Distance (Feet): 125 Feet Assistive device: Rolling walker;1 person hand held assist Ambulation/Gait Assistance Details: Pt required mod A with hand-held and pt reaching and grasping objects with her free hand. Pt improved with walker but still ataxic and unsteady. Gait Pattern: Step-through pattern;Decreased step length -  right;Decreased step length - left;Ataxic;Narrow base of support Gait velocity: decr    Exercises     PT Diagnosis: Difficulty walking;Abnormality of gait  PT Problem List: Decreased balance;Decreased mobility;Decreased knowledge of use of DME;Decreased knowledge of precautions PT Treatment Interventions: DME instruction;Gait training;Patient/family education;Functional mobility training;Therapeutic activities;Therapeutic exercise;Balance training   PT Goals Acute Rehab PT Goals PT Goal Formulation: With patient Time For Goal Achievement: 10/29/12 Potential to Achieve Goals: Good Pt will go Supine/Side to Sit: with modified independence PT Goal: Supine/Side to Sit - Progress: Goal set today Pt will go Sit to Supine/Side: with modified independence PT Goal: Sit to Supine/Side - Progress: Goal set today Pt will go Sit to Stand: with supervision PT Goal: Sit to Stand - Progress: Goal set today Pt will go Stand to Sit: with supervision PT Goal: Stand to Sit - Progress: Goal set today Pt will Ambulate: 51 - 150 feet;with supervision;with least restrictive assistive device PT Goal: Ambulate - Progress: Goal set today  Visit Information  Last PT Received On: 10/22/12 Assistance Needed: +1    Subjective Data  Subjective: "I need to learn to do this myself," pt stated about using the walker. 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  Pt also became easily flustered.    Extremity/Trunk Assessment Right Lower Extremity Assessment RLE ROM/Strength/Tone: [facility] for tasks assessed Left Lower Extremity Assessment LLE ROM/Strength/Tone: WFL for tasks assessed   Balance Balance Balance Assessed: Yes Static Standing Balance Static Standing - Balance Support: Right upper extremity supported Static Standing - Level of Assistance: 4: Min assist Dynamic Standing Balance Dynamic Standing - Balance Support: Right upper extremity supported Dynamic Standing - Level of Assistance: 3: Mod assist  End of Session PT - End of Session Equipment Utilized During Treatment: Gait belt Activity Tolerance: Patient tolerated treatment well Patient left: in bed;with call bell/phone within reach;with bed alarm set Nurse Communication: Mobility status  GP     Emaree Chiu 10/22/2012, 10:14 AM  [employer] PT [phone]

## 2012-10-22 NOTE — Progress Notes (Signed)
Utilization review completed.  

## 2012-10-22 NOTE — Evaluation (Signed)
Occupational Therapy Evaluation Patient Details Name: Nicole Sparks MRN: [id number] DOB: 12-Feb-1932 Today's Date: 10/22/2012 Time: 1640-1700 OT Time Calculation (min): 20 min  OT Assessment / Plan / Recommendation Clinical Impression  Pt admitted with rt parietal subacute ICH. Will benefit from continued acute OT services to address below problem list.  Pt independent at baseline.  Recommending CIR to further progress rehab before eventual return home.    OT Assessment  Patient needs continued OT Services    Follow Up Recommendations  CIR    Barriers to Discharge      Equipment Recommendations   (tbd)    Recommendations for Other Services Rehab consult  Frequency  Min 3X/week    Precautions / Restrictions Precautions Precautions: Fall   Pertinent Vitals/Pain See vitals    ADL  Grooming: Performed;Wash/dry hands;Min guard Where Assessed - Grooming: Unsupported standing Upper Body Bathing: Simulated;Supervision/safety Where Assessed - Upper Body Bathing: Unsupported sitting Lower Body Bathing: Simulated;Minimal assistance Where Assessed - Lower Body Bathing: Supported sit to stand Upper Body Dressing: Simulated;Set up Where Assessed - Upper Body Dressing: Unsupported sitting Lower Body Dressing: Simulated;Minimal assistance Where Assessed - Lower Body Dressing: Supported sit to stand Toilet Transfer: Performed;Moderate assistance Toilet Transfer Method:  (ambulating) Toilet Transfer Equipment: Regular height toilet;Grab bars Toileting - Clothing Manipulation and Hygiene: Performed;Minimal assistance Where Assessed - Toileting Clothing Manipulation and Hygiene: Sit to stand from 3-in-1 or toilet Equipment Used: Gait belt;Rolling walker Transfers/Ambulation Related to ADLs: Mod assist with RW. Assist for safety with RW and pt constantly veering left and bumping into objects on left. ADL Comments: Pt consistently throughout session requiring verbal and tactile cueing to  locate objects on left side.   Pt very distracted during session asking every few minutes when she was going to MRI- very anxious.    OT Diagnosis: Cognitive deficits;Generalized weakness;Disturbance of vision  OT Problem List: Decreased strength;Decreased activity tolerance;Impaired balance (sitting and/or standing);Impaired vision/perception;Decreased safety awareness;Decreased cognition;Decreased knowledge of use of DME or AE OT Treatment Interventions: Self-care/ADL training;DME and/or AE instruction;Therapeutic activities;Cognitive remediation/compensation;Visual/perceptual remediation/compensation;Patient/family education;Balance training   OT Goals Acute Rehab OT Goals OT Goal Formulation: With patient Time For Goal Achievement: 11/05/12 Potential to Achieve Goals: Good ADL Goals Pt Will Perform Grooming: with supervision;Standing at sink ADL Goal: Grooming - Progress: Goal set today Pt Will Transfer to Toilet: with supervision;with DME;Regular height toilet;Ambulation ADL Goal: Toilet Transfer - Progress: Goal set today Pt Will Perform Toileting - Clothing Manipulation: with supervision;Standing ADL Goal: Toileting - Clothing Manipulation - Progress: Goal set today Pt Will Perform Toileting - Hygiene: with supervision;Sit to stand from 3-in-1/toilet ADL Goal: Toileting - Hygiene - Progress: Goal set today Miscellaneous OT Goals Miscellaneous OT Goal #1: Pt will locate 5/5 items on left side with min verbal cueing. OT Goal: Miscellaneous Goal #1 - Progress: Goal set today Miscellaneous OT Goal #2: Pt will participate in further visual assessment. OT Goal: Miscellaneous Goal #2 - Progress: Goal set today  Visit Information  Last OT Received On: 10/22/12 Assistance Needed: +1    Subjective Data      Prior Functioning     Home Living Lives With: Daughter Available Help at Discharge: Family;Available PRN/intermittently Type of Home: House Prior Function Vocation:  Retired Dominant Hand: Right         Vision/Perception Vision - History Baseline Vision: No visual deficits Vision - Assessment Vision Assessment: Vision impaired - to be further tested in functional context Additional Comments: Pt demonstrating possible left field cut. Will further assess next  session. Pt too distracted/anxious about upcoming MRI to focus on visual assessment (attempted scanning assessment and pt getting distracted with other tasks).   Cognition  Cognition Arousal/Alertness: Awake/alert Behavior During Therapy: Anxious Overall Cognitive Status: Impaired/Different from baseline Area of Impairment: Safety/judgement Safety/Judgement: Decreased awareness of deficits General Comments: Pt demo'ing left inattention but denying that she does not have any problem (even though she was is running into objects during ambulation).    Extremity/Trunk Assessment Right Upper Extremity Assessment RUE ROM/Strength/Tone: [facility] for tasks assessed Left Upper Extremity Assessment LUE ROM/Strength/Tone: WFL for tasks assessed     Mobility Bed Mobility Bed Mobility: Supine to Sit;Sitting - Scoot to Edge of Bed;Sit to Supine Supine to Sit: 4: Min guard;HOB elevated;With rails Sitting - Scoot to Edge of Bed: 5: Supervision Sit to Supine: 4: Min assist;HOB flat Transfers Transfers: Sit to Stand;Stand to Sit Sit to Stand: 4: Min assist;From bed;From toilet;With upper extremity assist Stand to Sit: 4: Min assist;To toilet;To bed;With upper extremity assist Details for Transfer Assistance: Max verbal cues for hand placement on left side and for safe technique.     Exercise     Balance Balance Balance Assessed: Yes Static Sitting Balance Static Sitting - Balance Support: Feet supported;No upper extremity supported Static Sitting - Level of Assistance: 5: Stand by assistance Static Standing Balance Static Standing - Balance Support: During functional activity;No upper extremity  supported Static Standing - Level of Assistance: 5: Stand by assistance;4: Min assist Static Standing - Comment/# of Minutes: close guarding for safety while pt washing hands at sink.   End of Session OT - End of Session Equipment Utilized During Treatment: Gait belt Activity Tolerance: Patient tolerated treatment well Patient left: in bed;with call bell/phone within reach Nurse Communication: Mobility status  GO    10/22/2012 Cipriano Mile OTR/L Pager [phone] Office [phone]   Cipriano Mile 10/22/2012, 5:23 PM

## 2012-10-22 NOTE — Consult Note (Signed)
[facility] Medical Consultation  Nicole Sparks AVW:[id number] DOB: [birth year] DOA: 10/21/2012 PCP: Nicole Patella, MD   Requesting physician: Dr. Pearlean Brownie Date of consultation: 10/22/2012 Reason for consultation: Assumption of care  Impression/Recommendations Principal Problem:  Subacute cerebral intraparenchymal hematoma Active Problems:   Hyponatremia   Restless leg   Urethral stenosis   Recurrent UTI   Subacute confusional state    Subacute intraparenchymal hematoma of the right parietal lobe Being managed by Neurology.  Hyponatremia Likely chronic, suspect-could be contributing to symptoms of gait instability and confusion Checking urine and serum osmols, urine sodium and TSH Received IVF on admission.  Now discontinued.  Patient appears euvolemic. Start regular diet with 1800 ml fluid restriction  Urinary Tract Infection  Recurrent E-Coli infections since 02/2012 H/O incontinence, Bladder sling, Urethral stenosis and subsequently urostomy. On prophylatic macrodantin outpatient prescribed by Dr. [name] Rocephin inpatient Urine Culture Pending.  Normocytic Anemia No baseline Hgb available Denies excessive bruising / frank bleeding Guiac stool Check anemia panel  Weight Loss Since Jan 2013, 30 lb weight loss.  (148 -> 118) Suspicion for occult malignancy, Not clear if she [place] uptodate with her cancer screening. Neuro has ordered CT Chest and Abdomen.Will follow Patient still eating well Nutrition consult.  Falling Multiple falls at home over the past year. PT/OT consult Orthostatic vital signs  Mild Confusion Multiple possible etiologies: Intracranial Hematoma Mild dementia (check b12, tsh, folate) Takes xanax daily  Mild Hyponatremia  Restless leg ?possibly related to hyponatremia Has been taking xanax at home for this.  Xanax currently being held.    Chief Complaint: multiple falls and dizziness  HPI:  79 yo female with a history  of COPD, recurrent UTI, renal insufficiency, Urostomy due to urethral stenosis, and restless leg presented to the emergency department with complaints of dizziness and falling.  Nicole Sparks moved to [place] to live with her daughter, Nicole Sparks, two months ago from [place], [place].  History gathered from both the patient and her daughter reveals a 30 lb weight loss over the last 18 mos.  Mild confusion, and multiple falls and gait instability.  Despite taking antibiotics for her UTI she continued to become progressively weaker and came to the ED.  MRI revealed a subacute intracranial hematoma.  The patient was admitted on to Neurology's services.  They have subsequently asked [facility] to assume the role of primary service and they will consult.  The patient is being reviewed by CIR for possible rehab.  Review of Systems:  Weight loss as described, good appetite, occassional headaches, no changes in vision, no chest pain, no SOB, Cough or fever, no recent illness (other than UTIs).  No bruising or gross bleeding, no changes in bowel habit, vomiting or abdominal pain.  All other systems reviewed and found to be negative.  Past Medical History  Diagnosis Date  . COPD (chronic obstructive pulmonary disease)   . Asthma   . Arthritis   . Renal disorder    Past Surgical History  Procedure Laterality Date  . Revision urostomy cutaneous    . Bladder surgery    . Abdominal hysterectomy     Social History:  reports that she quit smoking about 24 years ago. She has never used smokeless tobacco. She reports that  drinks alcohol. She reports that she does not use illicit drugs.  Per daughter she "chain smoked" for 25 + years.  She drinks 1/2 glass of wine each night with dinner.  Independent with ADLs, was driving until 2 months ago.  Family History: Father died with lung CA Mother died with Cardiac Arrest but had Cancer of the Spleen Brother died with Cancer of the spinal cord.  Allergies   Allergen Reactions  . Sudafed (Pseudoephedrine)     Passes out    Prior to Admission medications   Medication Sig Start Date End Date Taking? Authorizing Provider  albuterol (PROVENTIL) (2.5 MG/3ML) 0.083% nebulizer solution Take 2.5 mg by nebulization every 4 (four) hours as needed for wheezing or shortness of breath.   Yes Historical Provider, MD  ALPRAZolam Prudy Feeler) 0.5 MG tablet Take 0.5 mg by mouth at bedtime.   Yes Historical Provider, MD  budesonide-formoterol (SYMBICORT) 80-4.5 MCG/ACT inhaler Inhale 2 puffs into the lungs 2 (two) times daily as needed (for shortnes of breath).   Yes Historical Provider, MD  calcium-vitamin D (OSCAL WITH D) 500-200 MG-UNIT per tablet Take 1 tablet by mouth 2 (two) times daily.   Yes Historical Provider, MD  cetirizine (ZYRTEC) 10 MG tablet Take 10 mg by mouth daily as needed for allergies.   Yes Historical Provider, MD  citalopram (CELEXA) 40 MG tablet Take 40 mg by mouth daily.   Yes Historical Provider, MD  Menthol, Topical Analgesic, (BENGAY EX) Apply 1 application topically at bedtime as needed (for knee/back pain).   Yes Historical Provider, MD  Multiple Vitamin (MULTIVITAMIN WITH MINERALS) TABS Take 1 tablet by mouth daily.   Yes Historical Provider, MD  nitrofurantoin (MACRODANTIN) 100 MG capsule Take 100 mg by mouth 2 (two) times daily. Taking until 10/23/12, then taper down to 1 cap daily.   Yes Historical Provider, MD  omeprazole (PRILOSEC) 20 MG capsule Take 20 mg by mouth daily as needed (for heartburn).   Yes Historical Provider, MD  traMADol (ULTRAM) 50 MG tablet Take 50 mg by mouth every 4 (four) hours as needed for pain.   Yes Historical Provider, MD   Physical Exam: Blood pressure 128/60, pulse 75, temperature 98.2 F (36.8 C), temperature source Oral, resp. rate 16, height 5\' 2"  (1.575 m), weight 53.7 kg (118 lb 6.2 oz), SpO2 98.00%. Filed Vitals:   10/22/12 0355 10/22/12 0752 10/22/12 0937 10/22/12 1144  BP: 130/66 116/68 128/60    Pulse: 67  75   Temp: 98.3 F (36.8 C) 98.1 F (36.7 C)  98.2 F (36.8 C)  TempSrc: Oral Oral  Oral  Resp: 16     Height:      Weight:      SpO2: 98% 98% 98%      General:  Wd, thin, frail female, sitting in chair eating lunch, pleasant, NAD  Eyes: sclera clear, conjunctiva pink, pupils equal and round  ENT: MMM, oropharynx pink with out exudate  Neck: supple without adenopathy  Cardiovascular: rrr, no m/r/g  Respiratory: cta no w/c/r  Abdomen: thin, soft, decreased BS, urostomy in place, site looks good  Skin: one dark bruise on left forearm, no other bruises noticed, no rash  Musculoskeletal: 5/5 strength in each extremity  Psychiatric: Cooperative, Appropriate, well groomed.  Neurologic: CN 2-12 grossly in tact, non focal.  I did not see the patient stand or walk.  Labs on Admission:  Basic Metabolic Panel:  Recent Labs Lab 10/21/12 1745  NA 128*  K 4.6  CL 94*  CO2 26  GLUCOSE 98  BUN 17  CREATININE 1.02  CALCIUM 9.1   Liver Function Tests:  Recent Labs Lab 10/21/12 1745  AST 20  ALT 16  ALKPHOS 67  BILITOT 0.1*  PROT 7.0  ALBUMIN 3.3*   CBC:  Recent Labs Lab 10/21/12 1745  WBC 6.9  NEUTROABS 4.1  HGB 10.8*  HCT 33.9*  MCV 81.3  PLT 316    Radiological Exams on Admission: Ct Head Wo Contrast  10/21/2012   *RADIOLOGY REPORT*  Clinical Data: Weakness.  UTI  CT HEAD WITHOUT CONTRAST  Technique:  Contiguous axial images were obtained from the base of the skull through the vertex without contrast.  Comparison: None  Findings: Generalized atrophy with prominent ventricles and subarachnoid space.  Chronic microvascular ischemic changes in the white matter.  15 mm lesion in the high right parietal lobe is mildly hyper dense compared to brain.  There is surrounding vasogenic edema.  Negative for subarachnoid or subdural hemorrhage.  No shift to the midline structures.  IMPRESSION: Hyperdense 15 mm lesion right high parietal lobe with  surrounding edema.  This may represent a metastatic disease containing acute hemorrhage.  This could be a hemorrhagic infarction or possibly an area resolving subacute hemorrhage.  Overall I would favor hemorrhagic metastatic disease.  Further evaluation with MRI brain without and with contrast is suggested.  I discussed the findings by telephone with Dr. Konrad Dolores   Original Report Authenticated By: Janeece Riggers, M.D.   Mr Laqueta Jean ZO Contrast  10/21/2012   *RADIOLOGY REPORT*  Clinical Data: Weakness.  Abnormal head CT.  MRI HEAD WITHOUT AND WITH CONTRAST  Technique:  Multiplanar, multiecho pulse sequences of the brain and surrounding structures were obtained according to standard protocol without and with intravenous contrast  Contrast: 10mL MULTIHANCE GADOBENATE DIMEGLUMINE 529 MG/ML IV SOLN  Comparison: Head CT same day  Findings: There is a 3.8 x 2.2 x 2.8 cm in diameter late subacute intraparenchymal hematoma in the medial right parietal lobe corresponding to the CT abnormality.  There is typical marginal enhancement.  There is some regional vasogenic edema.  There is a small amount of subdural blood along the right side of the posterior falx, no more than 1 mm in thickness.  There are chronic small vessel changes affecting the pons.  There is cerebellar atrophy but no focal cerebellar insult.  There are dilated perivascular spaces throughout the cerebral hemispheres. There are mild chronic small vessel changes within the deep white matter.  No evidence of neoplastic mass lesion.  No sign of acute hemorrhage.  No hydrocephalus.  No pituitary lesion.  No skull or skull base lesion.  IMPRESSION: The abnormality at CT represents a late subacute intraparenchymal hematoma.  There is mild regional vasogenic edema but no mass effect of significance.  Very thin amount of subdural blood along the right posterior falx.  The findings could be post-traumatic or they could represent an intraparenchymal hemorrhage which  penetrated into the subdural space with the underlying etiology been either hypertension or amyloid angiopathy.  Atrophy and chronic small vessel changes elsewhere throughout the brain.   Original Report Authenticated By: Paulina Fusi, M.D.     Time spent: 50 min.  Conley Canal [facility] Pager [phone]  If 7PM-7AM, please contact night-coverage [email or web address] Password [facility] 10/22/2012, 1:08 PM  Attending Seen and examined, agree with the assessment and plan as outlined above. Per Neurology, ICH is subacute, given weight loss and hyponatremia-there is some suspicion of malignancy. Await CT Chest and Abdomen. Stop IVF-place on fluid restriction. Repeat Chemistry in am.[facility] will assume Primary service  Windell Norfolk MD.

## 2012-10-22 NOTE — Progress Notes (Signed)
INITIAL NUTRITION ASSESSMENT  DOCUMENTATION CODES Per approved criteria  -Non-severe (moderate) malnutrition in the context of chronic illness   INTERVENTION:  Ensure Complete po BID, each supplement provides 350 kcal and 13 grams of protein.  NUTRITION DIAGNOSIS: Malnutrition related to inadequate intake as evidenced by 20% weight loss in one year and mild loss of muscle mass.   Goal: Intake to meet >90% of estimated nutrition needs.  Monitor:  PO intake, labs, weight trend.  Reason for Assessment: MD Consult for assessment of nutrition requirements/status  79 y.o. female  Admitting Dx: Dizziness and difficulty with gait  ASSESSMENT: Patient was brought in due to multiple falls related to poor balance for the past 3 weeks. Was diagnosed with UTI 3 weeks ago and has had poor balance since then. Patient reports that she has been eating well and activity has been normal, but has progressively lost weight over the past year. Usual weight ~148 lb. She says that she continues to lose weight despite good intake. Drinks Boost at home, also likes Ensure, vanilla and strawberry flavors. Has been eating 50-75% of meals since hospital admission.  Nutrition Focused Physical Exam:  Subcutaneous Fat:  Orbital Region: WNL Upper Arm Region: WNL Thoracic and Lumbar Region: NA  Muscle:  Temple Region: mild-moderate depletion Clavicle Bone Region: mild-moderate depletion Clavicle and Acromion Bone Region: mild-moderate depletion Scapular Bone Region: NA Dorsal Hand: mild-moderate depletion Patellar Region: WNL Anterior Thigh Region: WNL Posterior Calf Region: WNL  Edema: none  Pt meets criteria for non-severe (moderate) MALNUTRITION in the context of chronic illness as evidenced by 20% weight loss in the past year and mild loss of muscle mass.  Height: Ht Readings from Last 1 Encounters:  10/21/12 5\' 2"  (1.575 m)    Weight: Wt Readings from Last 1 Encounters:  10/21/12 118 lb 6.2  oz (53.7 kg)    Ideal Body Weight: 50 kg  % Ideal Body Weight: 107%  Wt Readings from Last 10 Encounters:  10/21/12 118 lb 6.2 oz (53.7 kg)   Usual Body Weight: 148 lb (1 year ago)  % Usual Body Weight: 80%  BMI:  Body mass index is 21.65 kg/(m^2).  Estimated Nutritional Needs: Kcal: 1350-1550 Protein: 70-80 gm Fluid: 1.4-1.6 L  Skin: no problems noted  Diet Order: Cardiac  EDUCATION NEEDS: -Education needs addressed-discussed ways to increase kcal and protein intake.   Intake/Output Summary (Last 24 hours) at 10/22/12 1248 Last data filed at 10/22/12 1144  Gross per 24 hour  Intake 1291.25 ml  Output   2200 ml  Net -908.75 ml    Labs:   Recent Labs Lab 10/21/12 1745  NA 128*  K 4.6  CL 94*  CO2 26  BUN 17  CREATININE 1.02  CALCIUM 9.1  GLUCOSE 98    CBG (last 3)  No results found for this basename: GLUCAP,  in the last 72 hours  Scheduled Meds: . Chlorhexidine Gluconate Cloth  6 each Topical Q0600  . citalopram  40 mg Oral Daily  . mupirocin ointment  1 application Nasal BID  . pantoprazole (PROTONIX) IV  40 mg Intravenous QHS  . senna-docusate  1 tablet Oral BID    Continuous Infusions:   Past Medical History  Diagnosis Date  . COPD (chronic obstructive pulmonary disease)   . Asthma   . Arthritis   . Renal disorder     Past Surgical History  Procedure Laterality Date  . Revision urostomy cutaneous    . Bladder surgery    .  Abdominal hysterectomy      Joaquin Courts, RD, LDN, CNSC Pager [phone] After Hours Pager [phone]

## 2012-10-22 NOTE — Progress Notes (Signed)
Consult in progress have ordered OT to eval ADL deficits

## 2012-10-22 NOTE — Progress Notes (Signed)
Rehab Admissions Coordinator Note:  Patient was screened by Brock Ra for appropriateness for an Inpatient Acute Rehab Consult.  At this time, we are recommending Inpatient Rehab consult.  Brock Ra 10/22/2012, 10:43 AM  I can be reached at [phone].

## 2012-10-22 NOTE — Evaluation (Signed)
Clinical/Bedside Swallow Evaluation Patient Details  Name: Nicole Sparks MRN: [id number] Date of Birth: [birth year]  Today's Date: 10/22/2012 Time: 7829-5621 SLP Time Calculation (min): 29 min  Past Medical History:  Past Medical History  Diagnosis Date  . COPD (chronic obstructive pulmonary disease)   . Asthma   . Arthritis   . Renal disorder    Past Surgical History:  Past Surgical History  Procedure Laterality Date  . Revision urostomy cutaneous    . Bladder surgery    . Abdominal hysterectomy     HPI:  79 y.o. female presenting with difficulty with gait and dizziness that has increased over the past 3 weeks.  Head CT has been reviewed and shows a hyperdense area in the high right parietal lobe.  MRI of the brain has been reviewed as well and shows the hemorrhage to be late subacute.    Although has been falling can not rule out other etiologies as well.      Assessment / Plan / Recommendation Clinical Impression  Pt presents mild to moderate cognitive defictis which are worsened from baseline. Pt required moderate verbal cues to commit information to working memory. Her reasoning and functional problem solving tasks are often incomplete due to anxiety and slow processing. Pt also impulsive with poor safety awareness and decreased awareness of defictis overall. Mild left neglect with funcitonal tasks is observed which PT and dtr confirm.  Recommend pt have acute f/u for further diagnostic evaluation and compensatory strategies for functional cognitive tasks. Pt would benefit from CIR stay to promote independence     Aspiration Risk       Diet Recommendation          Other  Recommendations     Follow Up Recommendations  Inpatient Rehab    Frequency and Duration min 1 x/week      Pertinent Vitals/Pain NA    SLP Swallow Goals     Swallow Study Prior Functional Status  Cognitive/Linguistic Baseline: Baseline deficits Baseline deficit details: Per dtr Nicole Sparks, pt has  had progressive cognitive decline over past 3 months including memory, awareness Type of Home: House Lives With: Daughter Available Help at Discharge: Family;Available PRN/intermittently Vocation: Retired    General HPI: 79 y.o. female presenting with difficulty with gait and dizziness that has increased over the past 3 weeks.  Head CT has been reviewed and shows a hyperdense area in the high right parietal lobe.  MRI of the brain has been reviewed as well and shows the hemorrhage to be late subacute.    Although has been falling can not rule out other etiologies as well.       Oral/Motor/Sensory Function Overall Oral Motor/Sensory Function: Appears within functional limits for tasks assessed   Ice Chips     Thin Liquid      Nectar Thick     Honey Thick     Puree     Solid   GO           Nicole Ditty, MA CCC-SLP [phone]  Nicole Sparks 10/22/2012,3:01 PM

## 2012-10-22 NOTE — Progress Notes (Signed)
Stroke Team Progress Note  HISTORY Nicole Sparks is an 79 y.o. female who has not been doing well for the past 3 weeks or so. The patient was diagnosed with a urinary tract infection and has had poor balance for that time. She has been through multiple antibiotic regimens. Due to her poor balance during this period of time she has had multiple falls. Today felt that she was dizzy and had more difficulty with gait. Called her daughter and was brought in for evaluation at that time.   Date last known well: Unable to determine  Time last known well: Unable to determine  tPA Given: No: ICH  SUBJECTIVE  No complaints. No headache. History of 45 pound weight loss in last 6 months with what sounds like inadequate workup.   OBJECTIVE Most recent Vital Signs: Filed Vitals:   10/21/12 2050 10/21/12 2255 10/21/12 2330 10/22/12 0355  BP: 156/75 167/76 159/75 130/66  Pulse: 74 66 68 67  Temp: 98.6 F (37 C) 98.7 F (37.1 C) 98.7 F (37.1 C) 98.3 F (36.8 C)  TempSrc: Oral Oral Oral Oral  Resp: 20 18 18 16   Height:  5\' 2"  (1.575 m)    Weight:  53.7 kg (118 lb 6.2 oz)    SpO2: 100% 98% 97% 98%   CBG (last 3)  No results found for this basename: GLUCAP,  in the last 72 hours  IV Fluid Intake:     MEDICATIONS  . sodium chloride   Intravenous STAT  . Chlorhexidine Gluconate Cloth  6 each Topical Q0600  . citalopram  40 mg Oral Daily  . mupirocin ointment  1 application Nasal BID  . pantoprazole (PROTONIX) IV  40 mg Intravenous QHS  . senna-docusate  1 tablet Oral BID   PRN:  acetaminophen, acetaminophen, labetalol, ondansetron (ZOFRAN) IV, traMADol  Diet:  Cardiac thin liquids Activity:   Bathroom privileges DVT Prophylaxis:  SCD  CLINICALLY SIGNIFICANT STUDIES Basic Metabolic Panel:  Recent Labs Lab 10/21/12 1745  NA 128*  K 4.6  CL 94*  CO2 26  GLUCOSE 98  BUN 17  CREATININE 1.02  CALCIUM 9.1   Liver Function Tests:  Recent Labs Lab 10/21/12 1745  AST 20  ALT 16   ALKPHOS 67  BILITOT 0.1*  PROT 7.0  ALBUMIN 3.3*   CBC:  Recent Labs Lab 10/21/12 1745  WBC 6.9  NEUTROABS 4.1  HGB 10.8*  HCT 33.9*  MCV 81.3  PLT 316   Coagulation: No results found for this basename: LABPROT, INR,  in the last 168 hours Cardiac Enzymes: No results found for this basename: CKTOTAL, CKMB, CKMBINDEX, TROPONINI,  in the last 168 hours Urinalysis:  Recent Labs Lab 10/21/12 1811  COLORURINE YELLOW  LABSPEC 1.012  PHURINE 6.5  GLUCOSEU NEGATIVE  HGBUR SMALL*  BILIRUBINUR NEGATIVE  KETONESUR NEGATIVE  PROTEINUR NEGATIVE  UROBILINOGEN 0.2  NITRITE POSITIVE*  LEUKOCYTESUR NEGATIVE   Lipid Panel No results found for this basename: chol, trig, hdl, cholhdl, vldl, ldlcalc   HgbA1C  No results found for this basename: HGBA1C    Urine Drug Screen:   No results found for this basename: labopia, cocainscrnur, labbenz, amphetmu, thcu, labbarb    Alcohol Level: No results found for this basename: ETH,  in the last 168 hours  Ct Head Wo Contrast 10/21/2012   Hyperdense 15 mm lesion right high parietal lobe with surrounding edema.  This may represent a metastatic disease containing acute hemorrhage.  This could be a hemorrhagic infarction or possibly  an area resolving subacute hemorrhage.  Overall I would favor hemorrhagic metastatic disease.  Further evaluation with MRI brain without and with contrast is suggested.   Mr Lodema Pilot Contrast 10/21/2012  The abnormality at CT represents a late subacute intraparenchymal hematoma.  There is mild regional vasogenic edema but no mass effect of significance.  Very thin amount of subdural blood along the right posterior falx.  The findings could be post-traumatic or they could represent an intraparenchymal hemorrhage which penetrated into the subdural space with the underlying etiology been either hypertension or amyloid angiopathy.  Atrophy and chronic small vessel changes elsewhere throughout the brain.     CT Chest  CT  Abd/Pelvis  MRA of the brain    2D Echocardiogram    Carotid Doppler    CXR    EKG     Therapy Recommendations   Physical Exam    Mental Status:  Alert, oriented, thought content appropriate. Speech fluent without evidence of aphasia. Able to follow 3 step commands without difficulty.  Cranial Nerves:  II: Discs flat bilaterally; Visual fields grossly normal, pupils equal, round, reactive to light and accommodation  III,IV, VI: ptosis not present, extra-ocular motions intact bilaterally  V,VII: smile symmetric, facial light touch sensation normal bilaterally  VIII: hearing normal bilaterally  IX,X: gag reflex present  XI: bilateral shoulder shrug  XII: midline tongue extension  Motor:  Right : Upper extremity 5/5 Left: Upper extremity 5/5  Lower extremity 5/5 Lower extremity 5/5  Tone and bulk:normal tone throughout; no atrophy noted. Positive palmomental.  Sensory: Pinprick and light touch intact throughout, bilaterally  Deep Tendon Reflexes: 2+ and symmetric throughout  Plantars:  Right: upgoing Left: upgoing  Cerebellar:  normal finger-to-nose and normal heel-to-shin test  Gait: Unable to test   ASSESSMENT [name] is a 79 y.o. female presenting with dizziness and gait disorder. 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 Cytotoxic edema. Patient has had 45 pound weight loss in 5 months. Infarct felt to be thrombotic, workup underway.  On no anticoagulants prior to admission. Now on no anticoagulants for secondary stroke prevention due to bleed. Patient with resultant dizziness and gait abnormality. Work up underway.   Abnormal urine  Passed swallow screen  Unintentional weight loss  Recent fall  Hospital day # 1  TREATMENT/PLAN  Continue no anticoagulants due to bleed for secondary stroke prevention.  Urine culture  Weight loss workup  to include cancer markers, CT chest, abd, pelvis  Ask for assist in treatment/care with hospitalists. Spoke to Dr Jerral Ralph  Discussion of medical concerns and plan of workup discussed with patient and with daughter by phone with Dr Pearlean Brownie.   Gwendolyn Lima. Manson Passey, [facility], MBA, MHA [facility] Pager: [phone] 10/22/2012 7:55 AM  I have personally obtained a history, examined the patient, evaluated imaging results, and formulated the assessment and plan of care. I agree with the above. Delia Heady, MD

## 2012-10-23 ENCOUNTER — Ambulatory Visit: Payer: Medicare Other

## 2012-10-23 DIAGNOSIS — G936 Cerebral edema: Secondary | ICD-10-CM

## 2012-10-23 DIAGNOSIS — D509 Iron deficiency anemia, unspecified: Secondary | ICD-10-CM | POA: Diagnosis present

## 2012-10-23 DIAGNOSIS — Q438 Other specified congenital malformations of intestine: Secondary | ICD-10-CM

## 2012-10-23 DIAGNOSIS — K319 Disease of stomach and duodenum, unspecified: Secondary | ICD-10-CM | POA: Diagnosis present

## 2012-10-23 DIAGNOSIS — I619 Nontraumatic intracerebral hemorrhage, unspecified: Secondary | ICD-10-CM

## 2012-10-23 DIAGNOSIS — R634 Abnormal weight loss: Secondary | ICD-10-CM | POA: Diagnosis present

## 2012-10-23 DIAGNOSIS — W19XXXA Unspecified fall, initial encounter: Secondary | ICD-10-CM

## 2012-10-23 DIAGNOSIS — S065X9A Traumatic subdural hemorrhage with loss of consciousness of unspecified duration, initial encounter: Secondary | ICD-10-CM

## 2012-10-23 LAB — OSMOLALITY, URINE: Osmolality, Ur: 366 mOsm/kg — ABNORMAL LOW (ref 390–1090)

## 2012-10-23 LAB — FOLATE RBC: RBC Folate: 2126 ng/mL — ABNORMAL HIGH (ref >=366)

## 2012-10-23 LAB — BASIC METABOLIC PANEL
BUN: 12 mg/dL (ref 6–23)
CO2: 27 mEq/L (ref 19–32)
Chloride: 99 mEq/L (ref 96–112)
GFR calc Af Amer: 67 mL/min — ABNORMAL LOW (ref >90)
Potassium: 4.1 mEq/L (ref 3.5–5.1)

## 2012-10-23 LAB — CBC
HCT: 32.2 % — ABNORMAL LOW (ref 36.0–46.0)
Hemoglobin: 10.1 g/dL — ABNORMAL LOW (ref 12.0–15.0)
MCHC: 31.4 g/dL (ref 30.0–36.0)
MCV: 81.3 fL (ref 78.0–100.0)
WBC: 5.7 10*3/uL (ref 4.0–10.5)

## 2012-10-23 LAB — SODIUM, URINE, RANDOM: Sodium, Ur: 121 mEq/L

## 2012-10-23 MED ORDER — PANTOPRAZOLE SODIUM 40 MG PO TBEC
40.0000 mg | DELAYED_RELEASE_TABLET | Freq: Every day | ORAL | Status: DC
  Administered 2012-10-23 – 2012-10-26 (×4): 40 mg via ORAL
  Filled 2012-10-23 (×3): qty 1

## 2012-10-23 MED ORDER — ENOXAPARIN SODIUM 30 MG/0.3ML ~~LOC~~ SOLN
30.0000 mg | SUBCUTANEOUS | Status: DC
  Administered 2012-10-23 – 2012-10-26 (×3): 30 mg via SUBCUTANEOUS
  Filled 2012-10-23 (×4): qty 0.3

## 2012-10-23 NOTE — Progress Notes (Signed)
Stroke Team Progress Note  HISTORY Nicole Sparks is an 79 y.o. female who has not been doing well for the past 3 weeks or so. The patient was diagnosed with a urinary tract infection and has had poor balance for that time. She has been through multiple antibiotic regimens. Due to her poor balance during this period of time she has had multiple falls. Today felt that she was dizzy and had more difficulty with gait. Called her daughter and was brought in for evaluation at that time.   Date last known well: Unable to determine  Time last known well: Unable to determine  tPA Given: No: ICH  SUBJECTIVE Patient up in chair at bedside. She is requesting to get back in bed.  OBJECTIVE Most recent Vital Signs: Filed Vitals:   10/22/12 2345 10/23/12 0330 10/23/12 0400 10/23/12 0756  BP: 142/68 148/71 148/71   Pulse: 75 70 68   Temp: 98.6 F (37 C)  98.8 F (37.1 C) 98.5 F (36.9 C)  TempSrc: Oral  Oral Oral  Resp: 24 20 19    Height:      Weight:      SpO2: 98% 98% 97%    CBG (last 3)  No results found for this basename: GLUCAP,  in the last 72 hours  IV Fluid Intake:     MEDICATIONS  . Chlorhexidine Gluconate Cloth  6 each Topical Q0600  . citalopram  40 mg Oral Daily  . feeding supplement  237 mL Oral BID BM  . mupirocin ointment  1 application Nasal BID  . pantoprazole  40 mg Oral Q1200  . senna-docusate  1 tablet Oral BID   PRN:  acetaminophen, acetaminophen, labetalol, ondansetron (ZOFRAN) IV, traMADol  Diet:  General thin liquids Activity:   Bathroom privileges DVT Prophylaxis:  SCD  CLINICALLY SIGNIFICANT STUDIES Basic Metabolic Panel:   Recent Labs Lab 10/21/12 1745 10/23/12 0536  NA 128* 133*  K 4.6 4.1  CL 94* 99  CO2 26 27  GLUCOSE 98 101*  BUN 17 12  CREATININE 1.02 0.92  CALCIUM 9.1 9.2   Liver Function Tests:   Recent Labs Lab 10/21/12 1745  AST 20  ALT 16  ALKPHOS 67  BILITOT 0.1*  PROT 7.0  ALBUMIN 3.3*   CBC:   Recent Labs Lab  10/21/12 1745 10/23/12 0536  WBC 6.9 5.7  NEUTROABS 4.1  --   HGB 10.8* 10.1*  HCT 33.9* 32.2*  MCV 81.3 81.3  PLT 316 263   Coagulation: No results found for this basename: LABPROT, INR,  in the last 168 hours Cardiac Enzymes: No results found for this basename: CKTOTAL, CKMB, CKMBINDEX, TROPONINI,  in the last 168 hours Urinalysis:   Recent Labs Lab 10/21/12 1811  COLORURINE YELLOW  LABSPEC 1.012  PHURINE 6.5  GLUCOSEU NEGATIVE  HGBUR SMALL*  BILIRUBINUR NEGATIVE  KETONESUR NEGATIVE  PROTEINUR NEGATIVE  UROBILINOGEN 0.2  NITRITE POSITIVE*  LEUKOCYTESUR NEGATIVE   Lipid Panel No results found for this basename: chol,  trig,  hdl,  cholhdl,  vldl,  ldlcalc   HgbA1C  No results found for this basename: HGBA1C   Urine Drug Screen:   No results found for this basename: labopia,  cocainscrnur,  labbenz,  amphetmu,  thcu,  labbarb    Alcohol Level: No results found for this basename: ETH,  in the last 168 hours  Ct Head Wo Contrast 10/21/2012   Hyperdense 15 mm lesion right high parietal lobe with surrounding edema.  This may represent a  metastatic disease containing acute hemorrhage.  This could be a hemorrhagic infarction or possibly an area resolving subacute hemorrhage.  Overall I would favor hemorrhagic metastatic disease.  Further evaluation with MRI brain without and with contrast is suggested.   Mr Lodema Pilot Contrast 10/21/2012  The abnormality at CT represents a late subacute intraparenchymal hematoma.  There is mild regional vasogenic edema but no mass effect of significance.  Very thin amount of subdural blood along the right posterior falx.  The findings could be post-traumatic or they could represent an intraparenchymal hemorrhage which penetrated into the subdural space with the underlying etiology been either hypertension or amyloid angiopathy.  Atrophy and chronic small vessel changes elsewhere throughout the brain.     CT Chest 10/22/2012 Large hiatal hernia.  COPD with questionable 6 mm right apex nodule and tiny bilateral pleural effusions.  Osseous demineralization with old bilateral rib fractures. Age indeterminate fractures left 6th rib and the superior plate T8.  CT Abd/Pelvis 5/22/2014Left hydronephrosis and hydroureter appears to be due to obstruction at the anastomoses with the urinary diversion question stricture. Colonic diverticulosis without evidence of diverticulitis. Duodenal  wall thickening, could potentially be an artifact from underdistension but other etiologies including duodenitis/inflammation, infection, or less likely tumor not completely excluded. Large hiatal hernia.  EKG     Therapy Recommendations CIR  Physical Exam   Mental Status:  Alert, oriented, thought content appropriate. Speech fluent without evidence of aphasia. Able to follow 3 step commands without difficulty.  Cranial Nerves:  II: Discs flat bilaterally; Visual fields grossly normal, pupils equal, round, reactive to light and accommodation  III,IV, VI: ptosis not present, extra-ocular motions intact bilaterally  V,VII: smile symmetric, facial light touch sensation normal bilaterally  VIII: hearing normal bilaterally  IX,X: gag reflex present  XI: bilateral shoulder shrug  XII: midline tongue extension  Motor:  Right : Upper extremity 5/5 Left: Upper extremity 5/5  Lower extremity 5/5 Lower extremity 5/5  Tone and bulk:normal tone throughout; no atrophy noted. Positive palmomental.  Sensory: Pinprick and light touch intact throughout, bilaterally  Deep Tendon Reflexes: 2+ and symmetric throughout  Plantars:  Right: upgoing Left: upgoing  Cerebellar:  normal finger-to-nose and normal heel-to-shin test  Gait: Unable to test   ASSESSMENT [name] is a 79 y.o. female presenting with dizziness and gait disorder. Imaging confirms a right parietal  subacute intraparenchmymal hemotoma representative of hypertension (doubt as BP only 150-160s),  amyloid or possibly metastatic disease. Has cytotoxic edema and small SDH which is likely related to recent fall. On no antithrombotics prior to admission. Patient with resultant dizziness and gait abnormality. rehabWork up underway.   Abnormal urine, cx pending  Passed swallow screen  Unintentional weight loss - suspicious for cancer  Recent fall  45 pound weight loss in 5 months.   Hospital day # 2  TREATMENT/PLAN  No antiplatelets or anticoagulants given spontaneous hemorrhage  F/u Urine culture  lovenox for VTE prophylaxis  Cancer workup by medicine  CIR recommended at discharge  Annie Main, MSN, RN, ANVP-BC, ANP-BC, GNP-BC [facility] Pager: [phone] 10/23/2012 9:00 AM  I have personally obtained a history, examined the patient, evaluated imaging results, and formulated the assessment and plan of care. I agree with the above.  Delia Heady, MD

## 2012-10-23 NOTE — Consult Note (Signed)
Reason for Consult: Abnormal CT scan Referring Physician: [facility]  Ferne Reus HPI: This is a 79 year old female admitted for a subacute intracranial hematoma.  The patient has problems with her balance and subsequently fell.  In fact, she falls many times, but after the last fall she had problems with her gait.  Since her admission she was evaluated with a CT scan as she complained of a 50 lbs weight loss.  The CT scan revealed an abnormal wall thickening in the duodenum.  No issues with abdominal pain, nausea, or vomiting.  She uses a laxative regimen to have routine bowel movements.  Her last colonoscopy, per her report, was a couple of years ago in [place], where she previously lived.  Past Medical History  Diagnosis Date  . COPD (chronic obstructive pulmonary disease)   . Asthma   . Arthritis   . Renal disorder   . Renal insufficiency     Past Surgical History  Procedure Laterality Date  . Revision urostomy cutaneous    . Bladder surgery    . Abdominal hysterectomy      History reviewed. No pertinent family history.  Social History:  reports that she quit smoking about 24 years ago. She has never used smokeless tobacco. She reports that  drinks alcohol. She reports that she does not use illicit drugs.  Allergies:  Allergies  Allergen Reactions  . Sudafed (Pseudoephedrine)     Passes out    Medications:  Scheduled: . Chlorhexidine Gluconate Cloth  6 each Topical Q0600  . citalopram  40 mg Oral Daily  . enoxaparin (LOVENOX) injection  30 mg Subcutaneous Q24H  . feeding supplement  237 mL Oral BID BM  . mupirocin ointment  1 application Nasal BID  . pantoprazole  40 mg Oral Q1200  . senna-docusate  1 tablet Oral BID   Continuous:   Results for orders placed during the hospital encounter of 10/21/12 (from the past 24 hour(s))  BASIC METABOLIC PANEL     Status: Abnormal   Collection Time    10/23/12  5:36 AM      Result Value Range   Sodium 133 (*) 135 -  145 mEq/L   Potassium 4.1  3.5 - 5.1 mEq/L   Chloride 99  96 - 112 mEq/L   CO2 27  19 - 32 mEq/L   Glucose, Bld 101 (*) 70 - 99 mg/dL   BUN 12  6 - 23 mg/dL   Creatinine, Ser 4.78  0.50 - 1.10 mg/dL   Calcium 9.2  8.4 - 29.5 mg/dL   GFR calc non Af Amer 58 (*) >90 mL/min   GFR calc Af Amer 67 (*) >90 mL/min  CBC     Status: Abnormal   Collection Time    10/23/12  5:36 AM      Result Value Range   WBC 5.7  4.0 - 10.5 K/uL   RBC 3.96  3.87 - 5.11 MIL/uL   Hemoglobin 10.1 (*) 12.0 - 15.0 g/dL   HCT 62.1 (*) 30.8 - 65.7 %   MCV 81.3  78.0 - 100.0 fL   MCH 25.5 (*) 26.0 - 34.0 pg   MCHC 31.4  30.0 - 36.0 g/dL   RDW 84.6 (*) 96.2 - 95.2 %   Platelets 263  150 - 400 K/uL  OSMOLALITY, URINE     Status: Abnormal   Collection Time    10/23/12  9:21 AM      Result Value Range  Osmolality, Ur 366 (*) 390 - 1090 mOsm/kg  SODIUM, URINE, RANDOM     Status: None   Collection Time    10/23/12  9:21 AM      Result Value Range   Sodium, Ur 121       Ct Head Wo Contrast  10/21/2012   *RADIOLOGY REPORT*  Clinical Data: Weakness.  UTI  CT HEAD WITHOUT CONTRAST  Technique:  Contiguous axial images were obtained from the base of the skull through the vertex without contrast.  Comparison: None  Findings: Generalized atrophy with prominent ventricles and subarachnoid space.  Chronic microvascular ischemic changes in the white matter.  15 mm lesion in the high right parietal lobe is mildly hyper dense compared to brain.  There is surrounding vasogenic edema.  Negative for subarachnoid or subdural hemorrhage.  No shift to the midline structures.  IMPRESSION: Hyperdense 15 mm lesion right high parietal lobe with surrounding edema.  This may represent a metastatic disease containing acute hemorrhage.  This could be a hemorrhagic infarction or possibly an area resolving subacute hemorrhage.  Overall I would favor hemorrhagic metastatic disease.  Further evaluation with MRI brain without and with contrast is  suggested.  I discussed the findings by telephone with Dr. Konrad Dolores   Original Report Authenticated By: Janeece Riggers, M.D.   Ct Chest W Contrast  10/22/2012   *RADIOLOGY REPORT*  Clinical Data:  40 pounds weight loss, diarrhea, history of urostomy, asthma, COPD, smoking  CT CHEST, ABDOMEN AND PELVIS WITH CONTRAST  Technique:  Multidetector CT imaging of the chest, abdomen and pelvis was performed following the standard protocol during bolus administration of intravenous contrast.  Sagittal and coronal MPR images reconstructed from axial data set.  Contrast: 80mL OMNIPAQUE IOHEXOL 300 MG/ML  SOLN Dilute oral contrast.  Comparison:  None  CT CHEST  Findings: Scattered atherosclerotic calcifications aorta and coronary arteries. No thoracic adenopathy. Large hiatal hernia. Changes of COPD without infiltrate or pneumothorax. Tiny nodular focus versus scarring at right apex 6 mm diameter image 7. Tiny bilateral pleural effusions larger on the left. Old fractures right fifth and sixth ribs anterolaterally. Age indeterminate left anterior sixth rib fracture, favor acute versus subacute. Old fractures posterior left eleventh and twelfth ribs. Superior plate compression fracture T8 age indeterminate.  IMPRESSION: Large hiatal hernia. COPD with questionable 6 mm right apex nodule and tiny bilateral pleural effusions. Osseous demineralization with old bilateral rib fractures. Age indeterminate fractures left 6th rib and the superior plate T8.  CT ABDOMEN AND PELVIS  Findings: Marked left hydronephrosis and hydroureter extending to urinary diversion in the central pelvis, consistent with an anastomotic stricture. Associated cortical thinning/cortical loss left kidney. No right hydronephrosis seen. Nonspecific 8 mm low attenuation focus in liver anteriorly image 57. Remainder of liver, spleen, pancreas, kidneys, and adrenal glands normal appearance. Urinary diversion decompressed, extends to the urostomy in the upper right  pelvis.  Scattered colonic diverticulosis without definite evidence of acute diverticulitis. Duodenal wall thickening. Remaining bowel loops unremarkable. No mass, adenopathy, free fluid or inflammatory process. Degenerative changes of bilateral hip joints.  IMPRESSION: Left hydronephrosis and hydroureter appears to be due to obstruction at the anastomoses with the urinary diversion question stricture. Colonic diverticulosis without evidence of diverticulitis. Duodenal wall thickening, could potentially be an artifact from underdistension but other etiologies including duodenitis/inflammation, infection, or less likely tumor not completely excluded. Large hiatal hernia.   Original Report Authenticated By: Ulyses Southward, M.D.   Mr Lodema Pilot Contrast  10/21/2012   *  RADIOLOGY REPORT*  Clinical Data: Weakness.  Abnormal head CT.  MRI HEAD WITHOUT AND WITH CONTRAST  Technique:  Multiplanar, multiecho pulse sequences of the brain and surrounding structures were obtained according to standard protocol without and with intravenous contrast  Contrast: 10mL MULTIHANCE GADOBENATE DIMEGLUMINE 529 MG/ML IV SOLN  Comparison: Head CT same day  Findings: There is a 3.8 x 2.2 x 2.8 cm in diameter late subacute intraparenchymal hematoma in the medial right parietal lobe corresponding to the CT abnormality.  There is typical marginal enhancement.  There is some regional vasogenic edema.  There is a small amount of subdural blood along the right side of the posterior falx, no more than 1 mm in thickness.  There are chronic small vessel changes affecting the pons.  There is cerebellar atrophy but no focal cerebellar insult.  There are dilated perivascular spaces throughout the cerebral hemispheres. There are mild chronic small vessel changes within the deep white matter.  No evidence of neoplastic mass lesion.  No sign of acute hemorrhage.  No hydrocephalus.  No pituitary lesion.  No skull or skull base lesion.  IMPRESSION: The  abnormality at CT represents a late subacute intraparenchymal hematoma.  There is mild regional vasogenic edema but no mass effect of significance.  Very thin amount of subdural blood along the right posterior falx.  The findings could be post-traumatic or they could represent an intraparenchymal hemorrhage which penetrated into the subdural space with the underlying etiology been either hypertension or amyloid angiopathy.  Atrophy and chronic small vessel changes elsewhere throughout the brain.   Original Report Authenticated By: Paulina Fusi, M.D.   Ct Abdomen Pelvis W Contrast  10/22/2012   *RADIOLOGY REPORT*  Clinical Data:  40 pounds weight loss, diarrhea, history of urostomy, asthma, COPD, smoking  CT CHEST, ABDOMEN AND PELVIS WITH CONTRAST  Technique:  Multidetector CT imaging of the chest, abdomen and pelvis was performed following the standard protocol during bolus administration of intravenous contrast.  Sagittal and coronal MPR images reconstructed from axial data set.  Contrast: 80mL OMNIPAQUE IOHEXOL 300 MG/ML  SOLN Dilute oral contrast.  Comparison:  None  CT CHEST  Findings: Scattered atherosclerotic calcifications aorta and coronary arteries. No thoracic adenopathy. Large hiatal hernia. Changes of COPD without infiltrate or pneumothorax. Tiny nodular focus versus scarring at right apex 6 mm diameter image 7. Tiny bilateral pleural effusions larger on the left. Old fractures right fifth and sixth ribs anterolaterally. Age indeterminate left anterior sixth rib fracture, favor acute versus subacute. Old fractures posterior left eleventh and twelfth ribs. Superior plate compression fracture T8 age indeterminate.  IMPRESSION: Large hiatal hernia. COPD with questionable 6 mm right apex nodule and tiny bilateral pleural effusions. Osseous demineralization with old bilateral rib fractures. Age indeterminate fractures left 6th rib and the superior plate T8.  CT ABDOMEN AND PELVIS  Findings: Marked left  hydronephrosis and hydroureter extending to urinary diversion in the central pelvis, consistent with an anastomotic stricture. Associated cortical thinning/cortical loss left kidney. No right hydronephrosis seen. Nonspecific 8 mm low attenuation focus in liver anteriorly image 57. Remainder of liver, spleen, pancreas, kidneys, and adrenal glands normal appearance. Urinary diversion decompressed, extends to the urostomy in the upper right pelvis.  Scattered colonic diverticulosis without definite evidence of acute diverticulitis. Duodenal wall thickening. Remaining bowel loops unremarkable. No mass, adenopathy, free fluid or inflammatory process. Degenerative changes of bilateral hip joints.  IMPRESSION: Left hydronephrosis and hydroureter appears to be due to obstruction at the anastomoses with the urinary  diversion question stricture. Colonic diverticulosis without evidence of diverticulitis. Duodenal wall thickening, could potentially be an artifact from underdistension but other etiologies including duodenitis/inflammation, infection, or less likely tumor not completely excluded. Large hiatal hernia.   Original Report Authenticated By: Ulyses Southward, M.D.    ROS:  As stated above in the HPI otherwise negative.  Blood pressure 140/70, pulse 78, temperature 98.6 F (37 C), temperature source Oral, resp. rate 19, height 5\' 2"  (1.575 m), weight 118 lb 6.2 oz (53.7 kg), SpO2 97.00%.    PE: Gen: NAD, Alert and Oriented HEENT:  [place]/AT, EOMI Neck: Supple, no LAD Lungs: CTA Bilaterally CV: RRR without M/G/R ABM: Soft, NTND, +BS Ext: No C/C/E  Assessment/Plan: 1) Abnormal CT scan. 2) Intracranial hemorrhage. 3) Weight loss - lost husband recently.  Plan: 1) EGD for further evaluation.  I do not think she has a malignancy in that region, rather it is an artifact.  Janay Canan D 10/23/2012, 4:11 PM

## 2012-10-23 NOTE — Progress Notes (Signed)
[facility] Progress Note [place] TEAM 1 - Stepdown/ICU TEAM   Ragna Kramlich YQM:[id number] DOB: March 24, 1932 DOA: 10/21/2012 PCP: Lolita Patella, MD  Brief narrative: 79 yo female with a history of COPD, recurrent UTI, renal insufficiency, Urostomy due to urethral stenosis, and restless leg presented to the emergency department with complaints of dizziness and falling. Ms. Abascal moved to [place] to live with her daughter, [name], two months ago from [place], [place]. History gathered from both the patient and her daughter reveals a 30 lb weight loss over the last 18 mos. Mild confusion, and multiple falls and gait instability. Despite taking antibiotics for her UTI she continued to become progressively weaker and came to the ED. MRI revealed a subacute intracranial hematoma. The patient was admitted on to Neurology's services. They have subsequently asked [facility] to assume the role of primary service and they will consult. The patient is being reviewed by CIR for possible rehab.   Assessment/Plan: Principal Problem:   Traumatic cerebral intraparenchymal hematoma -CT Head questioned possible malignancy related bleed but MRI more c/w post traumatic vs HTN bleed vs amyloid angiopathy -Neuro following -exam remains stable/non focal   Active Problems:   Recurrent falls -previously lived alone (husband died in past 12 months) but now resides with daughter who is case Production designer, theatre/television/film with [employer] -plan is for CIR once medically stable    Hyponatremia -recurrent and urine Na+ today elevated at 121 and suggestive of SIADH but labs have been obtained AFTER 1 liter NS bolus so will need to be repated in 48 hrs (5/25) -cont Fluid Restriction for now     Recurrent UTI/history of Urethral stenosis/urostomy -followed by [name] as OP -on Macrodantin chronically pre admit -current UA c/w UTI but will follow up on cx before beginning any anbx's    Metabolic  encephalopathy -resolved and likely due to low sodium and head trauma -MRI/CT with atrophy and microvascular changes so likely has a degree of dementia    Duodenal thickening -wall thickening seen on CT scan -no postprandial or early satiety sx's reported by pt when questioned -GI/Hung consulted-needs EGD    Restless leg    Weight loss, non-intentional -pt attributes to increased personal stressors (spouse died past 12 months) -basically not hungry (see above)    Iron deficiency anemia -will eventually need oral replete   DVT prophylaxis: SCDs Code Status: Full Family Communication: Patient and daughter Eliese Kerwood work phone [phone] Disposition Plan: Transfer to floor Isolation: Contact for MRSA PCR positive status  Consultants: Neurology Gastroenterology  Procedures: Anticipate EGD this admission  Antibiotics: Rocephin x1 dose in the emergency department 10/21/2012  HPI/Subjective: Patient alert and quite talkative. Currently denies abdominal pain, shortness of breath, chest pain or dizziness. Requested we speak with her daughter [name] to discuss plan of care   Objective: Blood pressure 154/85, pulse 78, temperature 98 F (36.7 C), temperature source Oral, resp. rate 19, height 5\' 2"  (1.575 m), weight 53.7 kg (118 lb 6.2 oz), SpO2 98.00%.  Intake/Output Summary (Last 24 hours) at 10/23/12 1438 Last data filed at 10/23/12 1209  Gross per 24 hour  Intake    237 ml  Output   2050 ml  Net  -1813 ml     Exam: General: No acute respiratory distress Lungs: Clear to auscultation bilaterally without wheezes or crackles, RA Cardiovascular: Regular rate and rhythm without murmur gallop or rub normal S1 and S2, no peripheral edema or JVD Abdomen: Nontender, nondistended, soft, bowel sounds positive, no rebound, no ascites,  no appreciable mass Musculoskeletal: No significant cyanosis, clubbing of bilateral lower extremities Neurological: Alert and oriented x 3, moves  all extremities x 4 without focal neurological deficits, CN 2-12 intact  Scheduled Meds: Scheduled Meds: . Chlorhexidine Gluconate Cloth  6 each Topical Q0600  . citalopram  40 mg Oral Daily  . enoxaparin (LOVENOX) injection  30 mg Subcutaneous Q24H  . feeding supplement  237 mL Oral BID BM  . mupirocin ointment  1 application Nasal BID  . pantoprazole  40 mg Oral Q1200  . senna-docusate  1 tablet Oral BID   Continuous Infusions:   Data Reviewed: Basic Metabolic Panel:  Recent Labs Lab 10/21/12 1745 10/23/12 0536  NA 128* 133*  K 4.6 4.1  CL 94* 99  CO2 26 27  GLUCOSE 98 101*  BUN 17 12  CREATININE 1.02 0.92  CALCIUM 9.1 9.2   Liver Function Tests:  Recent Labs Lab 10/21/12 1745  AST 20  ALT 16  ALKPHOS 67  BILITOT 0.1*  PROT 7.0  ALBUMIN 3.3*   No results found for this basename: LIPASE, AMYLASE,  in the last 168 hours No results found for this basename: AMMONIA,  in the last 168 hours CBC:  Recent Labs Lab 10/21/12 1745 10/23/12 0536  WBC 6.9 5.7  NEUTROABS 4.1  --   HGB 10.8* 10.1*  HCT 33.9* 32.2*  MCV 81.3 81.3  PLT 316 263   Cardiac Enzymes: No results found for this basename: CKTOTAL, CKMB, CKMBINDEX, TROPONINI,  in the last 168 hours BNP (last 3 results) No results found for this basename: PROBNP,  in the last 8760 hours CBG: No results found for this basename: GLUCAP,  in the last 168 hours  Recent Results (from the past 240 hour(s))  MRSA PCR SCREENING     Status: Abnormal   Collection Time    10/21/12 10:52 PM      Result Value Range Status   MRSA by PCR POSITIVE (*) NEGATIVE Final   Comment:            The GeneXpert MRSA Assay (FDA     approved for NASAL specimens     only), is one component of a     comprehensive MRSA colonization     surveillance program. It is not     intended to diagnose MRSA     infection nor to guide or     monitor treatment for     MRSA infections.     RESULT CALLED TO, READ BACK BY AND VERIFIED WITH:      MUSIAL,B RN 10/22/2012 0301 JORDANS     Studies:  Recent x-ray studies have been reviewed in detail by the Attending Physician  Scheduled Meds:  Reviewed in detail by the Attending Physician   Junious Silk, ANP [facility] Office  [phone] Pager [phone]  On-Call/Text Page:      [email or web address]      password [email or web address]  If 7PM-7AM, please contact night-coverage [email or web address] Password [email or web address] 10/23/2012, 2:38 PM   LOS: 2 days   I have examined the patient, reviewed the chart and modified the above note which I agree with.   Jaxsyn Azam,MD [phone] 10/23/2012, 7:21 PM

## 2012-10-23 NOTE — Progress Notes (Signed)
Physical Therapy Treatment Patient Details Name: Nicole Sparks MRN: [id number] DOB: November 30, 1931 Today's Date: 10/23/2012 Time:  -     PT Assessment / Plan / Recommendation Comments on Treatment Session  Pt making steady progress and ready for CIR.    Follow Up Recommendations  CIR     Does the patient have the potential to tolerate intense rehabilitation     Barriers to Discharge        Equipment Recommendations  Rolling walker with 5" wheels    Recommendations for Other Services    Frequency Min 4X/week   Plan Discharge plan remains appropriate;Frequency remains appropriate    Precautions / Restrictions Precautions Precautions: Fall   Pertinent Vitals/Pain N/A    Mobility  Bed Mobility Supine to Sit: 5: Supervision;HOB elevated;With rails Sitting - Scoot to Edge of Bed: 5: Supervision Transfers Sit to Stand: 4: Min assist;With upper extremity assist;From bed Stand to Sit: 4: Min assist;With upper extremity assist;With armrests;To chair/3-in-1 Details for Transfer Assistance: Verbal cues for hand placement and to assist with problem solving how to turn to chair with walker. Ambulation/Gait Ambulation/Gait Assistance: 3: Mod assist;4: Min assist Ambulation Distance (Feet): 175 Feet Assistive device: Rolling walker;1 person hand held assist Ambulation/Gait Assistance Details: Mod A with hand held and min A with walker.   Gait Pattern: Step-through pattern;Decreased step length - right;Decreased step length - left;Ataxic;Narrow base of support Gait velocity: decr General Gait Details: Amb around part of unit and then pt amb to bathroom and back.      Exercises     PT Diagnosis:    PT Problem List:   PT Treatment Interventions:     PT Goals Acute Rehab PT Goals PT Goal: Supine/Side to Sit - Progress: Progressing toward goal PT Goal: Sit to Supine/Side - Progress: Progressing toward goal PT Goal: Sit to Stand - Progress: Progressing toward goal PT Goal: Stand to  Sit - Progress: Progressing toward goal PT Goal: Ambulate - Progress: Progressing toward goal  Visit Information  Last PT Received On: 10/23/12    Subjective Data  Subjective: Pt states she will be moving to a different floor.   Cognition  Cognition Arousal/Alertness: Awake/alert Behavior During Therapy: Impulsive Overall Cognitive Status: Impaired/Different from baseline Area of Impairment: Safety/judgement;Problem solving;Following commands;Memory;Attention Safety/Judgement: Decreased awareness of deficits    Balance  Static Sitting Balance Static Sitting - Balance Support: Feet supported;No upper extremity supported Static Sitting - Level of Assistance: 5: Stand by assistance Static Standing Balance Static Standing - Balance Support: Right upper extremity supported Static Standing - Level of Assistance: 4: Min assist  End of Session PT - End of Session Equipment Utilized During Treatment: Gait belt Activity Tolerance: Patient tolerated treatment well Patient left: in chair;with call bell/phone within reach Nurse Communication: Mobility status   GP     [facility] 10/23/2012, 3:16 PM  [facility] PT [phone]

## 2012-10-23 NOTE — Progress Notes (Signed)
Patient is being transferred to 4N bed 4 via wheelchair. Phone report has been called to [place], rn. Patient is aware of the transfer. Will notify family .

## 2012-10-23 NOTE — Progress Notes (Signed)
I met with patient and then contacted her Daughter, [name], by phone. Patient would benefit from an inpt rehab admission prior to d/c home. Discussed with Dr. Butler Denmark . She will contact her daughter and then clarify with me if pt medically ready to d/c to IP rehab today or if further medical workup is needed prior to admission. I will follow up today. [phone]

## 2012-10-23 NOTE — H&P (Signed)
Physical Medicine and Rehabilitation Admission H&P    Chief Complaint  Patient presents with  . Weakness  . Urinary Tract Infection  : HPI: Nicole Sparks is a 79 y.o. right handed to female with history of COPD urethral stenosis with revision urostomy in the past. By report patient not doing well for the past 3 weeks with multiple falls and poor balance as well as reported 45 pound weight loss over the last 5 months.. Presented 10/21/2012 with dizziness and more difficulty with gait. MRI of the brain shows late subacute intraparenchymal hematoma. Patient did not receive TPA. Neurology services consulted with full workup ongoing. CT of chest 10/22/2012 showed large hiatal hernia. COPD with questionable 6 mm right apex nodule and tiny bilateral pleural effusions as well as old bilateral rib fractures. CT abdomen and pelvis with left hydronephrosis and hydroureter appears to be due to obstruction at the anastomosis with the urinary diversion question stricture. Colonic diverticulosis without evidence of diverticulitis. Duodenal thickening could potentially be an artifact from under distention but other etiologies included duodenitis inflammation or less likely tumor. CEA was pending. Underwent endoscopy 10/24/2012 was unremarkable. Contact precautions for positive MRSA nasal nares. Speech therapy evaluation completed 10/22/2012 noting mild to moderate cognitive deficits which are worsened from baseline. Patient required moderate verbal cues to commit information to working memory. Physical and occupational therapy evaluations completed 10/22/2012 with recommendations of physical medicine rehabilitation consult to consider inpatient rehabilitation services. Patient was felt to be a good candidate for inpatient rehabilitation services and was admitted for a comprehensive rehabilitation program   Review of Systems  Constitutional: Positive for weight loss.  Respiratory: Positive for cough.   Gastrointestinal:  Reflux  Musculoskeletal: Positive for joint pain. Restless leg syndrome Neurological: Positive for weakness.  Psychiatric/Behavioral: Positive for depression.  Anxiety  Genitourinary. Incontinence All other systems reviewed and are negative  Past Medical History  Diagnosis Date  . COPD (chronic obstructive pulmonary disease)   . Asthma   . Arthritis   . Renal disorder   . Renal insufficiency    Past Surgical History  Procedure Laterality Date  . Revision urostomy cutaneous    . Bladder surgery    . Abdominal hysterectomy     History reviewed. No pertinent family history. Social History:  reports that she quit smoking about 24 years ago. She has never used smokeless tobacco. She reports that  drinks alcohol. She reports that she does not use illicit drugs. Allergies:  Allergies  Allergen Reactions  . Sudafed (Pseudoephedrine)     Passes out   Medications Prior to Admission  Medication Sig Dispense Refill  . albuterol (PROVENTIL) (2.5 MG/3ML) 0.083% nebulizer solution Take 2.5 mg by nebulization every 4 (four) hours as needed for wheezing or shortness of breath.      . ALPRAZolam (XANAX) 0.5 MG tablet Take 0.5 mg by mouth at bedtime.      . budesonide-formoterol (SYMBICORT) 80-4.5 MCG/ACT inhaler Inhale 2 puffs into the lungs 2 (two) times daily as needed (for shortnes of breath).      . calcium-vitamin D (OSCAL WITH D) 500-200 MG-UNIT per tablet Take 1 tablet by mouth 2 (two) times daily.      . cetirizine (ZYRTEC) 10 MG tablet Take 10 mg by mouth daily as needed for allergies.      . citalopram (CELEXA) 40 MG tablet Take 40 mg by mouth daily.      . Menthol, Topical Analgesic, (BENGAY EX) Apply 1 application topically at bedtime as needed (for  knee/back pain).      . Multiple Vitamin (MULTIVITAMIN WITH MINERALS) TABS Take 1 tablet by mouth daily.      . nitrofurantoin (MACRODANTIN) 100 MG capsule Take 100 mg by mouth 2 (two) times daily. Taking until  10/23/12, then taper down to 1 cap daily.      Marland Kitchen omeprazole (PRILOSEC) 20 MG capsule Take 20 mg by mouth daily as needed (for heartburn).      . traMADol (ULTRAM) 50 MG tablet Take 50 mg by mouth every 4 (four) hours as needed for pain.        Home: Home Living Lives With: Daughter Available Help at Discharge: Family;Available PRN/intermittently Type of Home: House Home Access: Stairs to enter [employer] of Steps: 4-5 Entrance Stairs-Rails: Right Home Layout: One level Bathroom Shower/Tub: Other (comment) (Pt takes sponge bath due to ostomy.) Bathroom Toilet: Standard Home Adaptive Equipment: None   Functional History: Prior Function Able to Take Stairs?: Yes Vocation: Retired  Functional Status:  Mobility: Bed Mobility Bed Mobility: Supine to Sit;Sitting - Scoot to Edge of Bed;Sit to Supine Supine to Sit: 4: Min guard;HOB elevated;With rails Sitting - Scoot to Edge of Bed: 5: Supervision Sit to Supine: 4: Min assist;HOB flat Transfers Transfers: Sit to Stand;Stand to Sit Sit to Stand: 4: Min assist;From bed;From toilet;With upper extremity assist Stand to Sit: 4: Min assist;To toilet;To bed;With upper extremity assist Ambulation/Gait Ambulation/Gait Assistance: 3: Mod assist;4: Min assist Ambulation Distance (Feet): 125 Feet Assistive device: Rolling walker;1 person hand held assist Ambulation/Gait Assistance Details: Pt required mod A with hand-held and pt reaching and grasping objects with her free hand. Pt improved with walker but still ataxic and unsteady. Gait Pattern: Step-through pattern;Decreased step length - right;Decreased step length - left;Ataxic;Narrow base of support Gait velocity: decr    ADL: ADL Grooming: Performed;Wash/dry hands;Min guard Where Assessed - Grooming: Unsupported standing Upper Body Bathing: Simulated;Supervision/safety Where Assessed - Upper Body Bathing: Unsupported sitting Lower Body Bathing: Simulated;Minimal  assistance Where Assessed - Lower Body Bathing: Supported sit to stand Upper Body Dressing: Simulated;Set up Where Assessed - Upper Body Dressing: Unsupported sitting Lower Body Dressing: Simulated;Minimal assistance Where Assessed - Lower Body Dressing: Supported sit to stand Toilet Transfer: Performed;Moderate assistance Toilet Transfer Method:  (ambulating) Toilet Transfer Equipment: Regular height toilet;Grab bars Equipment Used: Gait belt;Rolling walker Transfers/Ambulation Related to ADLs: Mod assist with RW. Assist for safety with RW and pt constantly veering left and bumping into objects on left. ADL Comments: Pt consistently throughout session requiring verbal and tactile cueing to locate objects on left side.   Pt very distracted during session asking every few minutes when she was going to MRI- very anxious.  Cognition: Cognition Overall Cognitive Status: Impaired/Different from baseline Arousal/Alertness: Awake/alert Orientation Level: Oriented X4 Attention: Focused;Sustained;Selective Focused Attention: Appears intact Sustained Attention: Appears intact Selective Attention: Appears intact Memory: Impaired Memory Impairment: Storage deficit;Retrieval deficit Awareness: Impaired Awareness Impairment: Intellectual impairment;Emergent impairment Problem Solving: Impaired Problem Solving Impairment: Verbal complex;Functional basic Executive Function: Organizing;Sequencing;Reasoning Reasoning: Impaired Reasoning Impairment:  (slow requires moderate verbal cues and repetition) Sequencing: Impaired Sequencing Impairment: Functional complex Organizing: Impaired Organizing Impairment: Functional complex Behaviors: Poor frustration tolerance Safety/Judgment: Impaired Cognition Arousal/Alertness: Awake/alert Behavior During Therapy: Anxious Overall Cognitive Status: Impaired/Different from baseline Area of Impairment: Safety/judgement Safety/Judgement: Decreased awareness  of deficits General Comments: Pt demo'ing left inattention but denying that she does not have any problem (even though she was is running into objects during ambulation).  Physical Exam: Blood pressure 138/65, pulse 68,  temperature 98 F (36.7 C), temperature source Oral, resp. rate 19, height 5\' 2"  (1.575 m), weight 53.7 kg (118 lb 6.2 oz), SpO2 97.00%. Physical Exam  Vitals reviewed.  Constitutional: She is oriented to person, place, and time. She appears well-developed and well-nourished. No distress.  HENT:  Head: Normocephalic and atraumatic.  Right Ear: External ear normal.  Left Ear: External ear normal.  Eyes: Conjunctivae and EOM are normal. Pupils are equal, round, and reactive to light. Right eye exhibits no discharge. Left eye exhibits no discharge.  Neck: Normal range of motion. Neck supple. No JVD present. No tracheal deviation present. No thyromegaly present.  Cardiovascular: Normal rate and regular rhythm. Exam reveals no friction rub. No gallops No murmur heard.  Pulmonary/Chest: Effort normal and breath sounds normal. No respiratory distress. She has no wheezes. She has no rales. She exhibits no tenderness.  Abdominal: Soft. Bowel sounds are normal. She exhibits no distension. There is no tenderness. There is no rebound.  Musculoskeletal: She exhibits no edema.  Lymphadenopathy:  She has no cervical adenopathy.  Neurological: She is alert and oriented to person, place, and time.  Mild left sided weakness (4 to 4+). Left PN and decreased FMC on the left side.---was showing some improvement today in comparison to my consult exam. Sensation grossly intact. No CN findings. Cognitively had basic insight and awareness.---occasionally needed some redirection regarding topic of conversation. CN unremarkable.  Skin: Skin is dry.     Results for orders placed during the hospital encounter of 10/21/12 (from the past 48 hour(s))  CBC WITH DIFFERENTIAL     Status: Abnormal    Collection Time    10/21/12  5:45 PM      Result Value Range   WBC 6.9  4.0 - 10.5 K/uL   RBC 4.17  3.87 - 5.11 MIL/uL   Hemoglobin 10.8 (*) 12.0 - 15.0 g/dL   HCT 96.0 (*) 45.4 - 09.8 %   MCV 81.3  78.0 - 100.0 fL   MCH 25.9 (*) 26.0 - 34.0 pg   MCHC 31.9  30.0 - 36.0 g/dL   RDW 11.9 (*) 14.7 - 82.9 %   Platelets 316  150 - 400 K/uL   Neutrophils Relative % 59  43 - 77 %   Neutro Abs 4.1  1.7 - 7.7 K/uL   Lymphocytes Relative 25  12 - 46 %   Lymphs Abs 1.7  0.7 - 4.0 K/uL   Monocytes Relative 11  3 - 12 %   Monocytes Absolute 0.8  0.1 - 1.0 K/uL   Eosinophils Relative 4  0 - 5 %   Eosinophils Absolute 0.3  0.0 - 0.7 K/uL   Basophils Relative 0  0 - 1 %   Basophils Absolute 0.0  0.0 - 0.1 K/uL  COMPREHENSIVE METABOLIC PANEL     Status: Abnormal   Collection Time    10/21/12  5:45 PM      Result Value Range   Sodium 128 (*) 135 - 145 mEq/L   Potassium 4.6  3.5 - 5.1 mEq/L   Chloride 94 (*) 96 - 112 mEq/L   CO2 26  19 - 32 mEq/L   Glucose, Bld 98  70 - 99 mg/dL   BUN 17  6 - 23 mg/dL   Creatinine, Ser 5.62  0.50 - 1.10 mg/dL   Calcium 9.1  8.4 - 13.0 mg/dL   Total Protein 7.0  6.0 - 8.3 g/dL   Albumin 3.3 (*) 3.5 - 5.2 g/dL  AST 20  0 - 37 U/L   ALT 16  0 - 35 U/L   Alkaline Phosphatase 67  39 - 117 U/L   Total Bilirubin 0.1 (*) 0.3 - 1.2 mg/dL   GFR calc non Af Amer 51 (*) >90 mL/min   GFR calc Af Amer 59 (*) >90 mL/min   Comment:            The eGFR has been calculated     using the CKD EPI equation.     This calculation has not been     validated in all clinical     situations.     eGFR's persistently     <90 mL/min signify     possible Chronic Kidney Disease.  URINALYSIS, ROUTINE W REFLEX MICROSCOPIC     Status: Abnormal   Collection Time    10/21/12  6:11 PM      Result Value Range   Color, Urine YELLOW  YELLOW   APPearance CLOUDY (*) CLEAR   Specific Gravity, Urine 1.012  1.005 - 1.030   pH 6.5  5.0 - 8.0   Glucose, UA NEGATIVE  NEGATIVE mg/dL   Hgb  urine dipstick SMALL (*) NEGATIVE   Bilirubin Urine NEGATIVE  NEGATIVE   Ketones, ur NEGATIVE  NEGATIVE mg/dL   Protein, ur NEGATIVE  NEGATIVE mg/dL   Urobilinogen, UA 0.2  0.0 - 1.0 mg/dL   Nitrite POSITIVE (*) NEGATIVE   Leukocytes, UA NEGATIVE  NEGATIVE  URINE MICROSCOPIC-ADD ON     Status: Abnormal   Collection Time    10/21/12  6:11 PM      Result Value Range   RBC / HPF 3-6  <3 RBC/hpf   Bacteria, UA MANY (*) RARE  MRSA PCR SCREENING     Status: Abnormal   Collection Time    10/21/12 10:52 PM      Result Value Range   MRSA by PCR POSITIVE (*) NEGATIVE   Comment:            The GeneXpert MRSA Assay (FDA     approved for NASAL specimens     only), is one component of a     comprehensive MRSA colonization     surveillance program. It is not     intended to diagnose MRSA     infection nor to guide or     monitor treatment for     MRSA infections.     RESULT CALLED TO, READ BACK BY AND VERIFIED WITH:     MUSIAL,B RN 10/22/2012 0301 JORDANS  SEDIMENTATION RATE     Status: None   Collection Time    10/22/12 10:10 AM      Result Value Range   Sed Rate 10  0 - 22 mm/hr  OSMOLALITY     Status: None   Collection Time    10/22/12  2:19 PM      Result Value Range   Osmolality 284  275 - 300 mOsm/kg  TSH     Status: None   Collection Time    10/22/12  2:19 PM      Result Value Range   TSH 0.709  0.350 - 4.500 uIU/mL  VITAMIN B12     Status: None   Collection Time    10/22/12  2:19 PM      Result Value Range   Vitamin B-12 696  211 - 911 pg/mL  IRON AND TIBC     Status: Abnormal   Collection Time  10/22/12  2:19 PM      Result Value Range   Iron 25 (*) 42 - 135 ug/dL   TIBC 161  096 - 045 ug/dL   Saturation Ratios 7 (*) 20 - 55 %   UIBC 309  125 - 400 ug/dL  FERRITIN     Status: None   Collection Time    10/22/12  2:19 PM      Result Value Range   Ferritin 21  10 - 291 ng/mL  RETICULOCYTES     Status: None   Collection Time    10/22/12  2:19 PM      Result  Value Range   Retic Ct Pct 0.6  0.4 - 3.1 %   RBC. 4.10  3.87 - 5.11 MIL/uL   Retic Count, Manual 24.6  19.0 - 186.0 K/uL  BASIC METABOLIC PANEL     Status: Abnormal   Collection Time    10/23/12  5:36 AM      Result Value Range   Sodium 133 (*) 135 - 145 mEq/L   Potassium 4.1  3.5 - 5.1 mEq/L   Chloride 99  96 - 112 mEq/L   CO2 27  19 - 32 mEq/L   Glucose, Bld 101 (*) 70 - 99 mg/dL   BUN 12  6 - 23 mg/dL   Creatinine, Ser 4.09  0.50 - 1.10 mg/dL   Calcium 9.2  8.4 - 81.1 mg/dL   GFR calc non Af Amer 58 (*) >90 mL/min   GFR calc Af Amer 67 (*) >90 mL/min   Comment:            The eGFR has been calculated     using the CKD EPI equation.     This calculation has not been     validated in all clinical     situations.     eGFR's persistently     <90 mL/min signify     possible Chronic Kidney Disease.  CBC     Status: Abnormal   Collection Time    10/23/12  5:36 AM      Result Value Range   WBC 5.7  4.0 - 10.5 K/uL   RBC 3.96  3.87 - 5.11 MIL/uL   Hemoglobin 10.1 (*) 12.0 - 15.0 g/dL   HCT 91.4 (*) 78.2 - 95.6 %   MCV 81.3  78.0 - 100.0 fL   MCH 25.5 (*) 26.0 - 34.0 pg   MCHC 31.4  30.0 - 36.0 g/dL   RDW 21.3 (*) 08.6 - 57.8 %   Platelets 263  150 - 400 K/uL  SODIUM, URINE, RANDOM     Status: None   Collection Time    10/23/12  9:21 AM      Result Value Range   Sodium, Ur 121     Ct Head Wo Contrast  10/21/2012   *RADIOLOGY REPORT*  Clinical Data: Weakness.  UTI  CT HEAD WITHOUT CONTRAST  Technique:  Contiguous axial images were obtained from the base of the skull through the vertex without contrast.  Comparison: None  Findings: Generalized atrophy with prominent ventricles and subarachnoid space.  Chronic microvascular ischemic changes in the white matter.  15 mm lesion in the high right parietal lobe is mildly hyper dense compared to brain.  There is surrounding vasogenic edema.  Negative for subarachnoid or subdural hemorrhage.  No shift to the midline structures.   IMPRESSION: Hyperdense 15 mm lesion right high parietal lobe with surrounding edema.  This  may represent a metastatic disease containing acute hemorrhage.  This could be a hemorrhagic infarction or possibly an area resolving subacute hemorrhage.  Overall I would favor hemorrhagic metastatic disease.  Further evaluation with MRI brain without and with contrast is suggested.  I discussed the findings by telephone with Dr. Konrad Dolores   Original Report Authenticated By: Janeece Riggers, M.D.   Ct Chest W Contrast  10/22/2012   *RADIOLOGY REPORT*  Clinical Data:  40 pounds weight loss, diarrhea, history of urostomy, asthma, COPD, smoking  CT CHEST, ABDOMEN AND PELVIS WITH CONTRAST  Technique:  Multidetector CT imaging of the chest, abdomen and pelvis was performed following the standard protocol during bolus administration of intravenous contrast.  Sagittal and coronal MPR images reconstructed from axial data set.  Contrast: 80mL OMNIPAQUE IOHEXOL 300 MG/ML  SOLN Dilute oral contrast.  Comparison:  None  CT CHEST  Findings: Scattered atherosclerotic calcifications aorta and coronary arteries. No thoracic adenopathy. Large hiatal hernia. Changes of COPD without infiltrate or pneumothorax. Tiny nodular focus versus scarring at right apex 6 mm diameter image 7. Tiny bilateral pleural effusions larger on the left. Old fractures right fifth and sixth ribs anterolaterally. Age indeterminate left anterior sixth rib fracture, favor acute versus subacute. Old fractures posterior left eleventh and twelfth ribs. Superior plate compression fracture T8 age indeterminate.  IMPRESSION: Large hiatal hernia. COPD with questionable 6 mm right apex nodule and tiny bilateral pleural effusions. Osseous demineralization with old bilateral rib fractures. Age indeterminate fractures left 6th rib and the superior plate T8.  CT ABDOMEN AND PELVIS  Findings: Marked left hydronephrosis and hydroureter extending to urinary diversion in the central  pelvis, consistent with an anastomotic stricture. Associated cortical thinning/cortical loss left kidney. No right hydronephrosis seen. Nonspecific 8 mm low attenuation focus in liver anteriorly image 57. Remainder of liver, spleen, pancreas, kidneys, and adrenal glands normal appearance. Urinary diversion decompressed, extends to the urostomy in the upper right pelvis.  Scattered colonic diverticulosis without definite evidence of acute diverticulitis. Duodenal wall thickening. Remaining bowel loops unremarkable. No mass, adenopathy, free fluid or inflammatory process. Degenerative changes of bilateral hip joints.  IMPRESSION: Left hydronephrosis and hydroureter appears to be due to obstruction at the anastomoses with the urinary diversion question stricture. Colonic diverticulosis without evidence of diverticulitis. Duodenal wall thickening, could potentially be an artifact from underdistension but other etiologies including duodenitis/inflammation, infection, or less likely tumor not completely excluded. Large hiatal hernia.   Original Report Authenticated By: Ulyses Southward, M.D.   Mr Laqueta Jean ZO Contrast  10/21/2012   *RADIOLOGY REPORT*  Clinical Data: Weakness.  Abnormal head CT.  MRI HEAD WITHOUT AND WITH CONTRAST  Technique:  Multiplanar, multiecho pulse sequences of the brain and surrounding structures were obtained according to standard protocol without and with intravenous contrast  Contrast: 10mL MULTIHANCE GADOBENATE DIMEGLUMINE 529 MG/ML IV SOLN  Comparison: Head CT same day  Findings: There is a 3.8 x 2.2 x 2.8 cm in diameter late subacute intraparenchymal hematoma in the medial right parietal lobe corresponding to the CT abnormality.  There is typical marginal enhancement.  There is some regional vasogenic edema.  There is a small amount of subdural blood along the right side of the posterior falx, no more than 1 mm in thickness.  There are chronic small vessel changes affecting the pons.  There is  cerebellar atrophy but no focal cerebellar insult.  There are dilated perivascular spaces throughout the cerebral hemispheres. There are mild chronic small vessel changes within the deep  white matter.  No evidence of neoplastic mass lesion.  No sign of acute hemorrhage.  No hydrocephalus.  No pituitary lesion.  No skull or skull base lesion.  IMPRESSION: The abnormality at CT represents a late subacute intraparenchymal hematoma.  There is mild regional vasogenic edema but no mass effect of significance.  Very thin amount of subdural blood along the right posterior falx.  The findings could be post-traumatic or they could represent an intraparenchymal hemorrhage which penetrated into the subdural space with the underlying etiology been either hypertension or amyloid angiopathy.  Atrophy and chronic small vessel changes elsewhere throughout the brain.   Original Report Authenticated By: Paulina Fusi, M.D.   Ct Abdomen Pelvis W Contrast  10/22/2012   *RADIOLOGY REPORT*  Clinical Data:  40 pounds weight loss, diarrhea, history of urostomy, asthma, COPD, smoking  CT CHEST, ABDOMEN AND PELVIS WITH CONTRAST  Technique:  Multidetector CT imaging of the chest, abdomen and pelvis was performed following the standard protocol during bolus administration of intravenous contrast.  Sagittal and coronal MPR images reconstructed from axial data set.  Contrast: 80mL OMNIPAQUE IOHEXOL 300 MG/ML  SOLN Dilute oral contrast.  Comparison:  None  CT CHEST  Findings: Scattered atherosclerotic calcifications aorta and coronary arteries. No thoracic adenopathy. Large hiatal hernia. Changes of COPD without infiltrate or pneumothorax. Tiny nodular focus versus scarring at right apex 6 mm diameter image 7. Tiny bilateral pleural effusions larger on the left. Old fractures right fifth and sixth ribs anterolaterally. Age indeterminate left anterior sixth rib fracture, favor acute versus subacute. Old fractures posterior left eleventh and  twelfth ribs. Superior plate compression fracture T8 age indeterminate.  IMPRESSION: Large hiatal hernia. COPD with questionable 6 mm right apex nodule and tiny bilateral pleural effusions. Osseous demineralization with old bilateral rib fractures. Age indeterminate fractures left 6th rib and the superior plate T8.  CT ABDOMEN AND PELVIS  Findings: Marked left hydronephrosis and hydroureter extending to urinary diversion in the central pelvis, consistent with an anastomotic stricture. Associated cortical thinning/cortical loss left kidney. No right hydronephrosis seen. Nonspecific 8 mm low attenuation focus in liver anteriorly image 57. Remainder of liver, spleen, pancreas, kidneys, and adrenal glands normal appearance. Urinary diversion decompressed, extends to the urostomy in the upper right pelvis.  Scattered colonic diverticulosis without definite evidence of acute diverticulitis. Duodenal wall thickening. Remaining bowel loops unremarkable. No mass, adenopathy, free fluid or inflammatory process. Degenerative changes of bilateral hip joints.  IMPRESSION: Left hydronephrosis and hydroureter appears to be due to obstruction at the anastomoses with the urinary diversion question stricture. Colonic diverticulosis without evidence of diverticulitis. Duodenal wall thickening, could potentially be an artifact from underdistension but other etiologies including duodenitis/inflammation, infection, or less likely tumor not completely excluded. Large hiatal hernia.   Original Report Authenticated By: Ulyses Southward, M.D.    Post Admission Physician Evaluation: 1. Functional deficits secondary  to right parietal IPH, SDH which was likely traumatic from fall . 2. Patient is admitted to receive collaborative, interdisciplinary care between the physiatrist, rehab nursing staff, and therapy team. 3. Patient's level of medical complexity and substantial therapy needs in context of that medical necessity cannot be provided at  a lesser intensity of care such as a SNF. 4. Patient has experienced substantial functional loss from his/her baseline which was documented above under the "Functional History" and "Functional Status" headings.  Judging by the patient's diagnosis, physical exam, and functional history, the patient has potential for functional progress which will result in measurable gains while  on inpatient rehab.  These gains will be of substantial and practical use upon discharge  in facilitating mobility and self-care at the household level. 5. Physiatrist will provide 24 hour management of medical needs as well as oversight of the therapy plan/treatment and provide guidance as appropriate regarding the interaction of the two. 6. 24 hour rehab nursing will assist with bladder management, bowel management, safety, skin/wound care, disease management, medication administration, pain management and patient education  and help integrate therapy concepts, techniques,education, etc. 7. PT will assess and treat for/with: Lower extremity strength, range of motion, stamina, balance, functional mobility, safety, adaptive techniques and equipment, NMR, education.   Goals are: mod I to supervision. 8. OT will assess and treat for/with: ADL's, functional mobility, safety, upper extremity strength, adaptive techniques and equipment, NMR, education.   Goals are: mod I to supervision. 9. SLP will assess and treat for/with: cognition, awareness.  Goals are: mod I to supervision. 10. Case Management and Social Worker will assess and treat for psychological issues and discharge planning. 11. Team conference will be held weekly to assess progress toward goals and to determine barriers to discharge. 12. Patient will receive at least 3 hours of therapy per day at least 5 days per week. 13. ELOS: 7-10 days      Prognosis:  excellent   Medical Problem List and Plan: 1. Right parietal intraparenchymal hemorrhage, small subdural hematoma  likely traumatic 2. DVT Prophylaxis/Anticoagulation: Subcutaneous Lovenox 30 mg daily. Monitor platelet counts and any signs of bleeding 3. Pain Management: Ultram as needed. Monitor with increased mobility 4. Mood: Celexa 40 mg daily. Provide emotional support and positive reinforcement. SW to eval. Consider neuropsych eval as well.  5.. Neuropsych: This patient is capable of making decisions on her own behalf. 6. Progressive weight loss. CT abdomen chest to be reviewed. Workup ongoing rule out tumor. Endoscopy study unremarkable. CEA pending. Weight loss could be born out of depression related to loss of spouse. 7. Positive MRSA nasal nares. Contact precautions 8. COPD. Check oxygen saturations every shift. Patient quit smoking 24 years ago 9. Chronic suppression of UTI. Continue Macrobid. Check pvr's and emptying patterns.           Ranelle Oyster, MD, [facility] Physical Medicine & Rehabilitation  10/23/2012

## 2012-10-24 ENCOUNTER — Inpatient Hospital Stay (HOSPITAL_COMMUNITY): Payer: Medicare Other | Admitting: Occupational Therapy

## 2012-10-24 ENCOUNTER — Inpatient Hospital Stay (HOSPITAL_COMMUNITY): Payer: Medicare Other | Admitting: Physical Therapy

## 2012-10-24 ENCOUNTER — Encounter (HOSPITAL_COMMUNITY)
Admission: EM | Disposition: A | Discharge status: Rehabilitation Facility | Payer: Self-pay | Source: Home / Self Care | Attending: Internal Medicine

## 2012-10-24 ENCOUNTER — Encounter (HOSPITAL_COMMUNITY): Payer: Self-pay

## 2012-10-24 DIAGNOSIS — R933 Abnormal findings on diagnostic imaging of other parts of digestive tract: Secondary | ICD-10-CM

## 2012-10-24 HISTORY — PX: ESOPHAGOGASTRODUODENOSCOPY: SHX5428

## 2012-10-24 LAB — BASIC METABOLIC PANEL
BUN: 17 mg/dL (ref 6–23)
CO2: 24 mEq/L (ref 19–32)
Calcium: 8.7 mg/dL (ref 8.4–10.5)
Creatinine, Ser: 0.99 mg/dL (ref 0.50–1.10)
Glucose, Bld: 99 mg/dL (ref 70–99)

## 2012-10-24 LAB — CBC
Hemoglobin: 10.2 g/dL — ABNORMAL LOW (ref 12.0–15.0)
MCH: 25.5 pg — ABNORMAL LOW (ref 26.0–34.0)
MCV: 80.5 fL (ref 78.0–100.0)
RBC: 4 MIL/uL (ref 3.87–5.11)

## 2012-10-24 SURGERY — EGD (ESOPHAGOGASTRODUODENOSCOPY)
Anesthesia: Moderate Sedation

## 2012-10-24 MED ORDER — ALUM & MAG HYDROXIDE-SIMETH 200-200-20 MG/5ML PO SUSP
30.0000 mL | Freq: Three times a day (TID) | ORAL | Status: DC | PRN
  Administered 2012-10-24 – 2012-10-25 (×2): 30 mL via ORAL
  Filled 2012-10-24 (×2): qty 30

## 2012-10-24 MED ORDER — BUTAMBEN-TETRACAINE-BENZOCAINE 2-2-14 % EX AERO
INHALATION_SPRAY | CUTANEOUS | Status: DC | PRN
  Administered 2012-10-24: 2 via TOPICAL

## 2012-10-24 MED ORDER — FENTANYL CITRATE 0.05 MG/ML IJ SOLN
INTRAMUSCULAR | Status: DC | PRN
  Administered 2012-10-24: 25 ug via INTRAVENOUS
  Administered 2012-10-24: 12.5 ug via INTRAVENOUS

## 2012-10-24 MED ORDER — NITROFURANTOIN MONOHYD MACRO 100 MG PO CAPS
100.0000 mg | ORAL_CAPSULE | Freq: Every day | ORAL | Status: DC
  Administered 2012-10-25 – 2012-10-26 (×2): 100 mg via ORAL
  Filled 2012-10-24 (×3): qty 1

## 2012-10-24 MED ORDER — FENTANYL CITRATE 0.05 MG/ML IJ SOLN
INTRAMUSCULAR | Status: AC
  Filled 2012-10-24: qty 2

## 2012-10-24 MED ORDER — MIDAZOLAM HCL 5 MG/ML IJ SOLN
INTRAMUSCULAR | Status: AC
  Filled 2012-10-24: qty 2

## 2012-10-24 MED ORDER — SODIUM CHLORIDE 0.9 % IV SOLN: INTRAVENOUS | Status: DC

## 2012-10-24 MED ORDER — MIDAZOLAM HCL 10 MG/2ML IJ SOLN
INTRAMUSCULAR | Status: DC | PRN
  Administered 2012-10-24: 2 mg via INTRAVENOUS
  Administered 2012-10-24: 1 mg via INTRAVENOUS

## 2012-10-24 NOTE — Op Note (Signed)
[facility] [address] [place], [address]   ENDOSCOPY PROCEDURE REPORT  PATIENT: Nicole Sparks, Nicole Sparks  MR#: [id number] BIRTHDATE: 10/11/1931 , 81  yrs. old GENDER: Female ENDOSCOPIST: Hart Carwin, MD REFERRED BY:  Dr Thana Farr, Dr Jeani Hawking PROCEDURE DATE:  10/24/2012 PROCEDURE:  EGD, diagnostic ASA CLASS:     Class III INDICATIONS:  abnormal CT of the GI tract.thiockened duodenum, could represent underdistention MEDICATIONS: These medications were titrated to patient response per physician's verbal order, Versed-Detailed 3 mg IV, and Fentanyl-Detailed 37.5 mg IV TOPICAL ANESTHETIC: Cetacaine Spray  DESCRIPTION OF PROCEDURE: After the risks benefits and alternatives of the procedure were thoroughly explained, informed consent was obtained.  The Pentax Gastroscope [id number] endoscope was introduced through the mouth and advanced to the third portion of the duodenum. Without limitations.  The instrument was slowly withdrawn as the mucosa was fully examined.        ESOPHAGUS: A 6 cm nonreducible, hiatal hernia was noted, there was non obstructing esophageal ring.   Reflux esophagitis was found in the lower hird of the esophagus.- several <5 mm erosions in distal esophagus  Esophagitis was LA Grade A: breaks across 1-2 folds, <5 mm.  STOMACH: The mucosa of the stomach appeared normal.  DUODENUM: The duodenal mucosa showed no abnormalities.  Retroflexed views revealed no abnormalities.     The scope was then withdrawn from the patient and the procedure completed.  COMPLICATIONS: There were no complications. ENDOSCOPIC IMPRESSION: 1.   6 cm hiatal hernia -no Cameron erosions 2.   Esophagitis consistent with reflux esophagitis in the lower third of the esophagus 3.   The mucosa of the stomach appeared normal 4.   The duodenal mucosa showed no abnormalities - nothing to account for abnormal CT scan findings  RECOMMENDATIONS: antireflux  regimen PPI or H2RA REPEAT EXAM: no  eSigned:  Hart Carwin, MD 10/24/2012 11:05 AM   CC:  PATIENT NAME:  Louretta, Tantillo MR#: [id number]

## 2012-10-24 NOTE — Progress Notes (Signed)
Stroke Team Progress Note  HISTORY Nicole Sparks is a 79 y.o. female who had not been doing well for the past 3 weeks or so. The patient was diagnosed with a urinary tract infection and had poor balance for that time. She had been through multiple antibiotic regimens. Due to her poor balance during this period of time she had multiple falls. On 10/21/12 she felt that she was dizzy and had more difficulty with gait. She called her daughter and was brought in for evaluation at that time.   Date last known well: Unable to determine  Time last known well: Unable to determine  tPA Given: No: ICH  SUBJECTIVE There are no family members present this morning. The patient has no specific complaints. She is n.p.o. this morning for an upper endoscopy per GI.  OBJECTIVE Most recent Vital Signs: Filed Vitals:   10/23/12 1818 10/23/12 2139 10/24/12 0120 10/24/12 0555  BP: 153/76 139/63 159/66 139/62  Pulse: 73 73 61 56  Temp: 98.3 F (36.8 C) 98.6 F (37 C) 98.6 F (37 C) 97.5 F (36.4 C)  TempSrc: Oral Oral Oral Oral  Resp:  20 18 18   Height:      Weight:      SpO2: 98% 97% 98% 98%   CBG (last 3)  No results found for this basename: GLUCAP,  in the last 72 hours  IV Fluid Intake:     MEDICATIONS  . Chlorhexidine Gluconate Cloth  6 each Topical Q0600  . citalopram  40 mg Oral Daily  . enoxaparin (LOVENOX) injection  30 mg Subcutaneous Q24H  . feeding supplement  237 mL Oral BID BM  . mupirocin ointment  1 application Nasal BID  . pantoprazole  40 mg Oral Q1200  . senna-docusate  1 tablet Oral BID   PRN:  acetaminophen, acetaminophen, labetalol, ondansetron (ZOFRAN) IV, traMADol  Diet:  General thin liquids Activity:   Bathroom privileges DVT Prophylaxis:  SCD & Lovenox  CLINICALLY SIGNIFICANT STUDIES Basic Metabolic Panel:   Recent Labs Lab 10/23/12 0536 10/24/12 0620  NA 133* 133*  K 4.1 4.2  CL 99 98  CO2 27 24  GLUCOSE 101* 99  BUN 12 17  CREATININE 0.92 0.99   CALCIUM 9.2 8.7   Liver Function Tests:   Recent Labs Lab 10/21/12 1745  AST 20  ALT 16  ALKPHOS 67  BILITOT 0.1*  PROT 7.0  ALBUMIN 3.3*   CBC:   Recent Labs Lab 10/21/12 1745 10/23/12 0536 10/24/12 0620  WBC 6.9 5.7 5.6  NEUTROABS 4.1  --   --   HGB 10.8* 10.1* 10.2*  HCT 33.9* 32.2* 32.2*  MCV 81.3 81.3 80.5  PLT 316 263 268   Coagulation: No results found for this basename: LABPROT, INR,  in the last 168 hours Cardiac Enzymes: No results found for this basename: CKTOTAL, CKMB, CKMBINDEX, TROPONINI,  in the last 168 hours Urinalysis:   Recent Labs Lab 10/21/12 1811  COLORURINE YELLOW  LABSPEC 1.012  PHURINE 6.5  GLUCOSEU NEGATIVE  HGBUR SMALL*  BILIRUBINUR NEGATIVE  KETONESUR NEGATIVE  PROTEINUR NEGATIVE  UROBILINOGEN 0.2  NITRITE POSITIVE*  LEUKOCYTESUR NEGATIVE   Lipid Panel No results found for this basename: chol,  trig,  hdl,  cholhdl,  vldl,  ldlcalc   HgbA1C  No results found for this basename: HGBA1C   Urine Drug Screen:   No results found for this basename: labopia,  cocainscrnur,  labbenz,  amphetmu,  thcu,  labbarb    Alcohol Level:  No results found for this basename: ETH,  in the last 168 hours  Ct Head Wo Contrast 10/21/2012   Hyperdense 15 mm lesion right high parietal lobe with surrounding edema.  This may represent a metastatic disease containing acute hemorrhage.  This could be a hemorrhagic infarction or possibly an area resolving subacute hemorrhage.  Overall I would favor hemorrhagic metastatic disease.  Further evaluation with MRI brain without and with contrast is suggested.   Mr Lodema Pilot Contrast 10/21/2012  The abnormality at CT represents a late subacute intraparenchymal hematoma.  There is mild regional vasogenic edema but no mass effect of significance.  Very thin amount of subdural blood along the right posterior falx.  The findings could be post-traumatic or they could represent an intraparenchymal hemorrhage which  penetrated into the subdural space with the underlying etiology been either hypertension or amyloid angiopathy.  Atrophy and chronic small vessel changes elsewhere throughout the brain.     CT Chest 10/22/2012 Large hiatal hernia. COPD with questionable 6 mm right apex nodule and tiny bilateral pleural effusions.  Osseous demineralization with old bilateral rib fractures. Age indeterminate fractures left 6th rib and the superior plate T8.  CT Abd/Pelvis 5/22/2014Left hydronephrosis and hydroureter appears to be due to obstruction at the anastomoses with the urinary diversion question stricture. Colonic diverticulosis without evidence of diverticulitis. Duodenal  wall thickening, could potentially be an artifact from underdistension but other etiologies including duodenitis/inflammation, infection, or less likely tumor not completely excluded. Large hiatal hernia.  EKG     Therapy Recommendations CIR  Physical Exam   Mental Status:  Alert, oriented, thought content appropriate. Speech fluent without evidence of aphasia. Able to follow 3 step commands without difficulty.  Cranial Nerves:  II: Visual fields grossly normal, pupils equal, round, reactive to light and accommodation  III,IV, VI: ptosis not present, extra-ocular motions intact bilaterally  V,VII: smile symmetric, facial light touch sensation normal bilaterally  VIII: hearing normal bilaterally  IX,X: gag reflex present  XI: bilateral shoulder shrug  XII: midline tongue extension  Motor:  Right : Upper extremity 5/5 Left: Upper extremity 5/5  Lower extremity 5/5 Lower extremity 5/5  Tone and bulk:normal tone throughout; no atrophy noted. Positive palmomental.  Sensory: Pinprick and light touch intact throughout, bilaterally  Deep Tendon Reflexes: 2+ and symmetric throughout  Cerebellar:  normal finger-to-nose  Gait/station: sitting on side of bed, stable   ASSESSMENT [name] is a 79 y.o. female presenting with  dizziness and gait disorder. Imaging confirms a right parietal subacute intraparenchmymal hemotoma due to hypertension, amyloid angiopathy or metastatic disease. Has some cytotoxic edema and small SDH which is likely related to recent fall. On no antithrombotics prior to admission. Patient with resultant dizziness and gait abnormality. Rehab work up underway.   Abnormal urine, cx pending  Passed swallow screen  Unintentional weight loss - suspicious for cancer  Recent fall  45 pound weight loss in 5 months.   Anemia  Hospital day # 3  TREATMENT/PLAN  No antiplatelets or anticoagulants given spontaneous hemorrhage  F/u Urine culture - pending  lovenox for VTE prophylaxis  Cancer workup by medicine  CIR recommended at discharge  Upper endoscopy done today  Delton See PA-C [facility] Pager [phone] 10/24/2012, 8:14 AM  I have personally obtained a history, examined the patient, evaluated imaging results, and formulated the assessment and plan of care. I agree with the above.   Suanne Marker, MD 10/24/2012, 12:05 PM Certified in Neurology,  Neurophysiology and Neuroimaging [facility] - Stroke Team  Please refer to [email or web address] for on-call Stroke MD

## 2012-10-24 NOTE — Interval H&P Note (Signed)
History and Physical Interval Note:  10/24/2012 10:42 AM  Nicole Sparks  has presented today for surgery, with the diagnosis of Abnormal CT scan  The various methods of treatment have been discussed with the patient and family. After consideration of risks, benefits and other options for treatment, the patient has consented to  Procedure(s): ESOPHAGOGASTRODUODENOSCOPY (EGD) (N/A) as a surgical intervention .  The patient's history has been reviewed, patient examined, no change in status, stable for surgery.  I have reviewed the patient's chart and labs.  Questions were answered to the patient's satisfaction.     [name]

## 2012-10-24 NOTE — Progress Notes (Signed)
[facility] PROGRESS NOTE  Nicole Sparks OZH:[id number] DOB: 13-Feb-1932 DOA: 10/21/2012 PCP: Lolita Patella, MD  Assessment/Plan: Traumatic cerebral intraparenchymal hematoma  -Related to falls. -No further neurologic deficits. -Neurology following.  Recurrent falls  -previously lived alone (husband died in past 12 months) but now resides with daughter who is case Production designer, theatre/television/film with [employer]  -plan is for CIR once medically stable   Hyponatremia  -Stable at 133.  Recurrent UTI/history of Urethral stenosis/urostomy  -followed by [name] as OP  -on Macrodantin chronically pre admit  -current UA c/w UTI but will follow up on cx before beginning any anbx's   Metabolic encephalopathy  -resolved and likely due to low sodium and head trauma   Duodenal thickening  -wall thickening seen on CT scan  -no postprandial or early satiety sx's reported by pt when questioned  -EGD without duodenal abnormalities. -Does have some esophagitis for which PPI as have been recommended.  Restless leg   Weight loss, non-intentional  -pt attributes to increased personal stressors (spouse died past 12 months)  -basically not hungry (see above)    Code Status: Full code Family Communication: daughter [name] at bedside  Disposition Plan: CIR   Consultants:  GI, Dr. Juanda Chance  Neurology, Dr. Marjory Lies   Antibiotics:  Macrodantin (chronic suppressive therapy)   Subjective: Feels well other than fatigue.  Objective: Filed Vitals:   10/24/12 1116 10/24/12 1125 10/24/12 1209 10/24/12 1411  BP: 127/91 132/76 123/68 121/62  Pulse:   70 82  Temp:   98.2 F (36.8 C) 97.8 F (36.6 C)  TempSrc:   Oral Oral  Resp: 16 16 18 18   Height:      Weight:      SpO2: 98%  97% 99%    Intake/Output Summary (Last 24 hours) at 10/24/12 1533 Last data filed at 10/24/12 1300  Gross per 24 hour  Intake      0 ml  Output    575 ml  Net   -575 ml   Filed Weights   10/21/12 1633  10/21/12 2255  Weight: 53.524 kg (118 lb) 53.7 kg (118 lb 6.2 oz)    Exam:   General:  Alert, awake, oriented x3, no distress  Cardiovascular: Regular rate and rhythm, no murmurs, rubs or gallops  Respiratory: Clear to auscultation bilaterally  Abdomen: Soft, nontender, nondistended, positive bowel sounds  Extremities: No clubbing, cyanosis or edema, positive pedal pulses   Neurologic:  Grossly intact and nonfocal. I have not ambulated her  Data Reviewed: Basic Metabolic Panel:  Recent Labs Lab 10/21/12 1745 10/23/12 0536 10/24/12 0620  NA 128* 133* 133*  K 4.6 4.1 4.2  CL 94* 99 98  CO2 26 27 24   GLUCOSE 98 101* 99  BUN 17 12 17   CREATININE 1.02 0.92 0.99  CALCIUM 9.1 9.2 8.7   Liver Function Tests:  Recent Labs Lab 10/21/12 1745  AST 20  ALT 16  ALKPHOS 67  BILITOT 0.1*  PROT 7.0  ALBUMIN 3.3*   No results found for this basename: LIPASE, AMYLASE,  in the last 168 hours No results found for this basename: AMMONIA,  in the last 168 hours CBC:  Recent Labs Lab 10/21/12 1745 10/23/12 0536 10/24/12 0620  WBC 6.9 5.7 5.6  NEUTROABS 4.1  --   --   HGB 10.8* 10.1* 10.2*  HCT 33.9* 32.2* 32.2*  MCV 81.3 81.3 80.5  PLT 316 263 268   Cardiac Enzymes: No results found for this basename: CKTOTAL, CKMB, CKMBINDEX, TROPONINI,  in the last 168 hours BNP (last 3 results) No results found for this basename: PROBNP,  in the last 8760 hours CBG: No results found for this basename: GLUCAP,  in the last 168 hours  Recent Results (from the past 240 hour(s))  MRSA PCR SCREENING     Status: Abnormal   Collection Time    10/21/12 10:52 PM      Result Value Range Status   MRSA by PCR POSITIVE (*) NEGATIVE Final   Comment:            The GeneXpert MRSA Assay (FDA     approved for NASAL specimens     only), is one component of a     comprehensive MRSA colonization     surveillance program. It is not     intended to diagnose MRSA     infection nor to guide or      monitor treatment for     MRSA infections.     RESULT CALLED TO, READ BACK BY AND VERIFIED WITH:     MUSIAL,B RN 10/22/2012 0301 JORDANS     Studies: Ct Chest W Contrast  10/22/2012   *RADIOLOGY REPORT*  Clinical Data:  40 pounds weight loss, diarrhea, history of urostomy, asthma, COPD, smoking  CT CHEST, ABDOMEN AND PELVIS WITH CONTRAST  Technique:  Multidetector CT imaging of the chest, abdomen and pelvis was performed following the standard protocol during bolus administration of intravenous contrast.  Sagittal and coronal MPR images reconstructed from axial data set.  Contrast: 80mL OMNIPAQUE IOHEXOL 300 MG/ML  SOLN Dilute oral contrast.  Comparison:  None  CT CHEST  Findings: Scattered atherosclerotic calcifications aorta and coronary arteries. No thoracic adenopathy. Large hiatal hernia. Changes of COPD without infiltrate or pneumothorax. Tiny nodular focus versus scarring at right apex 6 mm diameter image 7. Tiny bilateral pleural effusions larger on the left. Old fractures right fifth and sixth ribs anterolaterally. Age indeterminate left anterior sixth rib fracture, favor acute versus subacute. Old fractures posterior left eleventh and twelfth ribs. Superior plate compression fracture T8 age indeterminate.  IMPRESSION: Large hiatal hernia. COPD with questionable 6 mm right apex nodule and tiny bilateral pleural effusions. Osseous demineralization with old bilateral rib fractures. Age indeterminate fractures left 6th rib and the superior plate T8.  CT ABDOMEN AND PELVIS  Findings: Marked left hydronephrosis and hydroureter extending to urinary diversion in the central pelvis, consistent with an anastomotic stricture. Associated cortical thinning/cortical loss left kidney. No right hydronephrosis seen. Nonspecific 8 mm low attenuation focus in liver anteriorly image 57. Remainder of liver, spleen, pancreas, kidneys, and adrenal glands normal appearance. Urinary diversion decompressed, extends to  the urostomy in the upper right pelvis.  Scattered colonic diverticulosis without definite evidence of acute diverticulitis. Duodenal wall thickening. Remaining bowel loops unremarkable. No mass, adenopathy, free fluid or inflammatory process. Degenerative changes of bilateral hip joints.  IMPRESSION: Left hydronephrosis and hydroureter appears to be due to obstruction at the anastomoses with the urinary diversion question stricture. Colonic diverticulosis without evidence of diverticulitis. Duodenal wall thickening, could potentially be an artifact from underdistension but other etiologies including duodenitis/inflammation, infection, or less likely tumor not completely excluded. Large hiatal hernia.   Original Report Authenticated By: Ulyses Southward, M.D.   Ct Abdomen Pelvis W Contrast  10/22/2012   *RADIOLOGY REPORT*  Clinical Data:  40 pounds weight loss, diarrhea, history of urostomy, asthma, COPD, smoking  CT CHEST, ABDOMEN AND PELVIS WITH CONTRAST  Technique:  Multidetector CT imaging of the  chest, abdomen and pelvis was performed following the standard protocol during bolus administration of intravenous contrast.  Sagittal and coronal MPR images reconstructed from axial data set.  Contrast: 80mL OMNIPAQUE IOHEXOL 300 MG/ML  SOLN Dilute oral contrast.  Comparison:  None  CT CHEST  Findings: Scattered atherosclerotic calcifications aorta and coronary arteries. No thoracic adenopathy. Large hiatal hernia. Changes of COPD without infiltrate or pneumothorax. Tiny nodular focus versus scarring at right apex 6 mm diameter image 7. Tiny bilateral pleural effusions larger on the left. Old fractures right fifth and sixth ribs anterolaterally. Age indeterminate left anterior sixth rib fracture, favor acute versus subacute. Old fractures posterior left eleventh and twelfth ribs. Superior plate compression fracture T8 age indeterminate.  IMPRESSION: Large hiatal hernia. COPD with questionable 6 mm right apex nodule and  tiny bilateral pleural effusions. Osseous demineralization with old bilateral rib fractures. Age indeterminate fractures left 6th rib and the superior plate T8.  CT ABDOMEN AND PELVIS  Findings: Marked left hydronephrosis and hydroureter extending to urinary diversion in the central pelvis, consistent with an anastomotic stricture. Associated cortical thinning/cortical loss left kidney. No right hydronephrosis seen. Nonspecific 8 mm low attenuation focus in liver anteriorly image 57. Remainder of liver, spleen, pancreas, kidneys, and adrenal glands normal appearance. Urinary diversion decompressed, extends to the urostomy in the upper right pelvis.  Scattered colonic diverticulosis without definite evidence of acute diverticulitis. Duodenal wall thickening. Remaining bowel loops unremarkable. No mass, adenopathy, free fluid or inflammatory process. Degenerative changes of bilateral hip joints.  IMPRESSION: Left hydronephrosis and hydroureter appears to be due to obstruction at the anastomoses with the urinary diversion question stricture. Colonic diverticulosis without evidence of diverticulitis. Duodenal wall thickening, could potentially be an artifact from underdistension but other etiologies including duodenitis/inflammation, infection, or less likely tumor not completely excluded. Large hiatal hernia.   Original Report Authenticated By: Ulyses Southward, M.D.    Scheduled Meds: . Chlorhexidine Gluconate Cloth  6 each Topical [id number]  . citalopram  40 mg Oral Daily  . enoxaparin (LOVENOX) injection  30 mg Subcutaneous Q24H  . feeding supplement  237 mL Oral BID BM  . mupirocin ointment  1 application Nasal BID  . nitrofurantoin (macrocrystal-monohydrate)  100 mg Oral Daily  . pantoprazole  40 mg Oral Q1200  . senna-docusate  1 tablet Oral BID   Continuous Infusions:   Principal Problem:   Traumatic cerebral intraparenchymal hematoma Active Problems:   Hyponatremia   Restless leg   Urethral stenosis    Recurrent UTI   Metabolic encephalopathy   Recurrent falls   Weight loss, non-intentional   Duodenum disorder   Iron deficiency anemia   Nonspecific (abnormal) findings on radiological and other examination of gastrointestinal tract   Reflux esophagitis    Time spent: 35 minutes    [name],[name]  [facility] Pager [phone]  If 7PM-7AM, please contact night-coverage at [email or web address], password [facility] 10/24/2012, 3:33 PM  LOS: 3 days

## 2012-10-24 NOTE — H&P (View-Only) (Signed)
Reason for Consult: Abnormal CT scan Referring Physician: [facility]  Rabab Caradonna HPI: This is a 79 year old female admitted for a subacute intracranial hematoma.  The patient has problems with her balance and subsequently fell.  In fact, she falls many times, but after the last fall she had problems with her gait.  Since her admission she was evaluated with a CT scan as she complained of a 50 lbs weight loss.  The CT scan revealed an abnormal wall thickening in the duodenum.  No issues with abdominal pain, nausea, or vomiting.  She uses a laxative regimen to have routine bowel movements.  Her last colonoscopy, per her report, was a couple of years ago in [place], where she previously lived.  Past Medical History  Diagnosis Date  . COPD (chronic obstructive pulmonary disease)   . Asthma   . Arthritis   . Renal disorder   . Renal insufficiency     Past Surgical History  Procedure Laterality Date  . Revision urostomy cutaneous    . Bladder surgery    . Abdominal hysterectomy      History reviewed. No pertinent family history.  Social History:  reports that she quit smoking about 24 years ago. She has never used smokeless tobacco. She reports that  drinks alcohol. She reports that she does not use illicit drugs.  Allergies:  Allergies  Allergen Reactions  . Sudafed (Pseudoephedrine)     Passes out    Medications:  Scheduled: . Chlorhexidine Gluconate Cloth  6 each Topical Q0600  . citalopram  40 mg Oral Daily  . enoxaparin (LOVENOX) injection  30 mg Subcutaneous Q24H  . feeding supplement  237 mL Oral BID BM  . mupirocin ointment  1 application Nasal BID  . pantoprazole  40 mg Oral Q1200  . senna-docusate  1 tablet Oral BID   Continuous:   Results for orders placed during the hospital encounter of 10/21/12 (from the past 24 hour(s))  BASIC METABOLIC PANEL     Status: Abnormal   Collection Time    10/23/12  5:36 AM      Result Value Range   Sodium 133 (*) 135 -  145 mEq/L   Potassium 4.1  3.5 - 5.1 mEq/L   Chloride 99  96 - 112 mEq/L   CO2 27  19 - 32 mEq/L   Glucose, Bld 101 (*) 70 - 99 mg/dL   BUN 12  6 - 23 mg/dL   Creatinine, Ser 0.92  0.50 - 1.10 mg/dL   Calcium 9.2  8.4 - 10.5 mg/dL   GFR calc non Af Amer 58 (*) >90 mL/min   GFR calc Af Amer 67 (*) >90 mL/min  CBC     Status: Abnormal   Collection Time    10/23/12  5:36 AM      Result Value Range   WBC 5.7  4.0 - 10.5 K/uL   RBC 3.96  3.87 - 5.11 MIL/uL   Hemoglobin 10.1 (*) 12.0 - 15.0 g/dL   HCT 32.2 (*) 36.0 - 46.0 %   MCV 81.3  78.0 - 100.0 fL   MCH 25.5 (*) 26.0 - 34.0 pg   MCHC 31.4  30.0 - 36.0 g/dL   RDW 17.2 (*) 11.5 - 15.5 %   Platelets 263  150 - 400 K/uL  OSMOLALITY, URINE     Status: Abnormal   Collection Time    10/23/12  9:21 AM      Result Value Range     Osmolality, Ur 366 (*) 390 - 1090 mOsm/kg  SODIUM, URINE, RANDOM     Status: None   Collection Time    10/23/12  9:21 AM      Result Value Range   Sodium, Ur 121       Ct Head Wo Contrast  10/21/2012   *RADIOLOGY REPORT*  Clinical Data: Weakness.  UTI  CT HEAD WITHOUT CONTRAST  Technique:  Contiguous axial images were obtained from the base of the skull through the vertex without contrast.  Comparison: None  Findings: Generalized atrophy with prominent ventricles and subarachnoid space.  Chronic microvascular ischemic changes in the white matter.  15 mm lesion in the high right parietal lobe is mildly hyper dense compared to brain.  There is surrounding vasogenic edema.  Negative for subarachnoid or subdural hemorrhage.  No shift to the midline structures.  IMPRESSION: Hyperdense 15 mm lesion right high parietal lobe with surrounding edema.  This may represent a metastatic disease containing acute hemorrhage.  This could be a hemorrhagic infarction or possibly an area resolving subacute hemorrhage.  Overall I would favor hemorrhagic metastatic disease.  Further evaluation with MRI brain without and with contrast is  suggested.  I discussed the findings by telephone with Dr. Merrell   Original Report Authenticated By: David Clark, M.D.   Ct Chest W Contrast  10/22/2012   *RADIOLOGY REPORT*  Clinical Data:  40 pounds weight loss, diarrhea, history of urostomy, asthma, COPD, smoking  CT CHEST, ABDOMEN AND PELVIS WITH CONTRAST  Technique:  Multidetector CT imaging of the chest, abdomen and pelvis was performed following the standard protocol during bolus administration of intravenous contrast.  Sagittal and coronal MPR images reconstructed from axial data set.  Contrast: 80mL OMNIPAQUE IOHEXOL 300 MG/ML  SOLN Dilute oral contrast.  Comparison:  None  CT CHEST  Findings: Scattered atherosclerotic calcifications aorta and coronary arteries. No thoracic adenopathy. Large hiatal hernia. Changes of COPD without infiltrate or pneumothorax. Tiny nodular focus versus scarring at right apex 6 mm diameter image 7. Tiny bilateral pleural effusions larger on the left. Old fractures right fifth and sixth ribs anterolaterally. Age indeterminate left anterior sixth rib fracture, favor acute versus subacute. Old fractures posterior left eleventh and twelfth ribs. Superior plate compression fracture T8 age indeterminate.  IMPRESSION: Large hiatal hernia. COPD with questionable 6 mm right apex nodule and tiny bilateral pleural effusions. Osseous demineralization with old bilateral rib fractures. Age indeterminate fractures left 6th rib and the superior plate T8.  CT ABDOMEN AND PELVIS  Findings: Marked left hydronephrosis and hydroureter extending to urinary diversion in the central pelvis, consistent with an anastomotic stricture. Associated cortical thinning/cortical loss left kidney. No right hydronephrosis seen. Nonspecific 8 mm low attenuation focus in liver anteriorly image 57. Remainder of liver, spleen, pancreas, kidneys, and adrenal glands normal appearance. Urinary diversion decompressed, extends to the urostomy in the upper right  pelvis.  Scattered colonic diverticulosis without definite evidence of acute diverticulitis. Duodenal wall thickening. Remaining bowel loops unremarkable. No mass, adenopathy, free fluid or inflammatory process. Degenerative changes of bilateral hip joints.  IMPRESSION: Left hydronephrosis and hydroureter appears to be due to obstruction at the anastomoses with the urinary diversion question stricture. Colonic diverticulosis without evidence of diverticulitis. Duodenal wall thickening, could potentially be an artifact from underdistension but other etiologies including duodenitis/inflammation, infection, or less likely tumor not completely excluded. Large hiatal hernia.   Original Report Authenticated By: Mark Boles, M.D.   Mr Brain W Wo Contrast  10/21/2012   *  RADIOLOGY REPORT*  Clinical Data: Weakness.  Abnormal head CT.  MRI HEAD WITHOUT AND WITH CONTRAST  Technique:  Multiplanar, multiecho pulse sequences of the brain and surrounding structures were obtained according to standard protocol without and with intravenous contrast  Contrast: 10mL MULTIHANCE GADOBENATE DIMEGLUMINE 529 MG/ML IV SOLN  Comparison: Head CT same day  Findings: There is a 3.8 x 2.2 x 2.8 cm in diameter late subacute intraparenchymal hematoma in the medial right parietal lobe corresponding to the CT abnormality.  There is typical marginal enhancement.  There is some regional vasogenic edema.  There is a small amount of subdural blood along the right side of the posterior falx, no more than 1 mm in thickness.  There are chronic small vessel changes affecting the pons.  There is cerebellar atrophy but no focal cerebellar insult.  There are dilated perivascular spaces throughout the cerebral hemispheres. There are mild chronic small vessel changes within the deep white matter.  No evidence of neoplastic mass lesion.  No sign of acute hemorrhage.  No hydrocephalus.  No pituitary lesion.  No skull or skull base lesion.  IMPRESSION: The  abnormality at CT represents a late subacute intraparenchymal hematoma.  There is mild regional vasogenic edema but no mass effect of significance.  Very thin amount of subdural blood along the right posterior falx.  The findings could be post-traumatic or they could represent an intraparenchymal hemorrhage which penetrated into the subdural space with the underlying etiology been either hypertension or amyloid angiopathy.  Atrophy and chronic small vessel changes elsewhere throughout the brain.   Original Report Authenticated By: Mark Shogry, M.D.   Ct Abdomen Pelvis W Contrast  10/22/2012   *RADIOLOGY REPORT*  Clinical Data:  40 pounds weight loss, diarrhea, history of urostomy, asthma, COPD, smoking  CT CHEST, ABDOMEN AND PELVIS WITH CONTRAST  Technique:  Multidetector CT imaging of the chest, abdomen and pelvis was performed following the standard protocol during bolus administration of intravenous contrast.  Sagittal and coronal MPR images reconstructed from axial data set.  Contrast: 80mL OMNIPAQUE IOHEXOL 300 MG/ML  SOLN Dilute oral contrast.  Comparison:  None  CT CHEST  Findings: Scattered atherosclerotic calcifications aorta and coronary arteries. No thoracic adenopathy. Large hiatal hernia. Changes of COPD without infiltrate or pneumothorax. Tiny nodular focus versus scarring at right apex 6 mm diameter image 7. Tiny bilateral pleural effusions larger on the left. Old fractures right fifth and sixth ribs anterolaterally. Age indeterminate left anterior sixth rib fracture, favor acute versus subacute. Old fractures posterior left eleventh and twelfth ribs. Superior plate compression fracture T8 age indeterminate.  IMPRESSION: Large hiatal hernia. COPD with questionable 6 mm right apex nodule and tiny bilateral pleural effusions. Osseous demineralization with old bilateral rib fractures. Age indeterminate fractures left 6th rib and the superior plate T8.  CT ABDOMEN AND PELVIS  Findings: Marked left  hydronephrosis and hydroureter extending to urinary diversion in the central pelvis, consistent with an anastomotic stricture. Associated cortical thinning/cortical loss left kidney. No right hydronephrosis seen. Nonspecific 8 mm low attenuation focus in liver anteriorly image 57. Remainder of liver, spleen, pancreas, kidneys, and adrenal glands normal appearance. Urinary diversion decompressed, extends to the urostomy in the upper right pelvis.  Scattered colonic diverticulosis without definite evidence of acute diverticulitis. Duodenal wall thickening. Remaining bowel loops unremarkable. No mass, adenopathy, free fluid or inflammatory process. Degenerative changes of bilateral hip joints.  IMPRESSION: Left hydronephrosis and hydroureter appears to be due to obstruction at the anastomoses with the urinary   diversion question stricture. Colonic diverticulosis without evidence of diverticulitis. Duodenal wall thickening, could potentially be an artifact from underdistension but other etiologies including duodenitis/inflammation, infection, or less likely tumor not completely excluded. Large hiatal hernia.   Original Report Authenticated By: Mark Boles, M.D.    ROS:  As stated above in the HPI otherwise negative.  Blood pressure 140/70, pulse 78, temperature 98.6 F (37 C), temperature source Oral, resp. rate 19, height 5' 2" (1.575 m), weight 118 lb 6.2 oz (53.7 kg), SpO2 97.00%.    PE: Gen: NAD, Alert and Oriented HEENT:  [place]/AT, EOMI Neck: Supple, no LAD Lungs: CTA Bilaterally CV: RRR without M/G/R ABM: Soft, NTND, +BS Ext: No C/C/E  Assessment/Plan: 1) Abnormal CT scan. 2) Intracranial hemorrhage. 3) Weight loss - lost husband recently.  Plan: 1) EGD for further evaluation.  I do not think she has a malignancy in that region, rather it is an artifact.  Idelle Reimann D 10/23/2012, 4:11 PM      

## 2012-10-25 ENCOUNTER — Inpatient Hospital Stay (HOSPITAL_COMMUNITY): Payer: Medicare Other | Admitting: Physical Therapy

## 2012-10-25 ENCOUNTER — Inpatient Hospital Stay (HOSPITAL_COMMUNITY): Payer: Medicare Other | Admitting: *Deleted

## 2012-10-25 LAB — URINE CULTURE
Colony Count: 50000
Special Requests: NORMAL

## 2012-10-25 NOTE — Progress Notes (Signed)
[facility] PROGRESS NOTE  Nicole Sparks YNW:[id number] DOB: 1931/06/26 DOA: 10/21/2012 PCP: Lolita Patella, MD  Assessment/Plan: Traumatic cerebral intraparenchymal hematoma  -Related to falls. -No further neurologic deficits. -Neurology following.  Recurrent falls  -previously lived alone (husband died in past 12 months) but now resides with daughter who is case Production designer, theatre/television/film with [employer]  -plan is for CIR  Hyponatremia  -Stable at 133.  Recurrent UTI/history of Urethral stenosis/urostomy  -followed by [name] as OP  -on Macrodantin chronically pre admit  -Cx with 50,000 enterococcus sensitive to macrobid. She is already on this for chronic suppression.  Metabolic encephalopathy  -resolved and likely due to low sodium and head trauma   Duodenal thickening  -wall thickening seen on CT scan  -no postprandial or early satiety sx's reported by pt when questioned  -EGD without duodenal abnormalities. -Does have some esophagitis for which PPI as have been recommended.  Restless leg   Weight loss, non-intentional  -pt attributes to increased personal stressors (spouse died past 12 months)  -basically not hungry (see above)    Code Status: Full code Family Communication: none today Disposition Plan: CIR   Consultants:  GI, Dr. Juanda Chance  Neurology, Dr. Marjory Lies   Antibiotics:  Macrodantin (chronic suppressive therapy)   Subjective: Feels well other than fatigue.  Objective: Filed Vitals:   10/24/12 2200 10/25/12 0200 10/25/12 0600 10/25/12 0955  BP: 145/70 126/70 140/68 148/55  Pulse: 67 62 62 62  Temp: 98.3 F (36.8 C) 97.8 F (36.6 C) 98.1 F (36.7 C) 98.5 F (36.9 C)  TempSrc:    Oral  Resp: 14 18 18    Height:      Weight:      SpO2: 97% 98% 98% 100%    Intake/Output Summary (Last 24 hours) at 10/25/12 1115 Last data filed at 10/25/12 0200  Gross per 24 hour  Intake      0 ml  Output    550 ml  Net   -550 ml   Filed Weights    10/21/12 1633 10/21/12 2255  Weight: 53.524 kg (118 lb) 53.7 kg (118 lb 6.2 oz)    Exam:   General:  Alert, awake, oriented x3, no distress  Cardiovascular: Regular rate and rhythm, no murmurs, rubs or gallops  Respiratory: Clear to auscultation bilaterally  Abdomen: Soft, nontender, nondistended, positive bowel sounds  Extremities: No clubbing, cyanosis or edema, positive pedal pulses   Neurologic:  Grossly intact and nonfocal. I have not ambulated her  Data Reviewed: Basic Metabolic Panel:  Recent Labs Lab 10/21/12 1745 10/23/12 0536 10/24/12 0620  NA 128* 133* 133*  K 4.6 4.1 4.2  CL 94* 99 98  CO2 26 27 24   GLUCOSE 98 101* 99  BUN 17 12 17   CREATININE 1.02 0.92 0.99  CALCIUM 9.1 9.2 8.7   Liver Function Tests:  Recent Labs Lab 10/21/12 1745  AST 20  ALT 16  ALKPHOS 67  BILITOT 0.1*  PROT 7.0  ALBUMIN 3.3*   No results found for this basename: LIPASE, AMYLASE,  in the last 168 hours No results found for this basename: AMMONIA,  in the last 168 hours CBC:  Recent Labs Lab 10/21/12 1745 10/23/12 0536 10/24/12 0620  WBC 6.9 5.7 5.6  NEUTROABS 4.1  --   --   HGB 10.8* 10.1* 10.2*  HCT 33.9* 32.2* 32.2*  MCV 81.3 81.3 80.5  PLT 316 263 268   Cardiac Enzymes: No results found for this basename: CKTOTAL, CKMB, CKMBINDEX, TROPONINI,  in  the last 168 hours BNP (last 3 results) No results found for this basename: PROBNP,  in the last 8760 hours CBG: No results found for this basename: GLUCAP,  in the last 168 hours  Recent Results (from the past 240 hour(s))  MRSA PCR SCREENING     Status: Abnormal   Collection Time    10/21/12 10:52 PM      Result Value Range Status   MRSA by PCR POSITIVE (*) NEGATIVE Final   Comment:            The GeneXpert MRSA Assay (FDA     approved for NASAL specimens     only), is one component of a     comprehensive MRSA colonization     surveillance program. It is not     intended to diagnose MRSA     infection  nor to guide or     monitor treatment for     MRSA infections.     RESULT CALLED TO, READ BACK BY AND VERIFIED WITH:     MUSIAL,B RN 10/22/2012 0301 JORDANS  URINE CULTURE     Status: None   Collection Time    10/23/12  9:21 AM      Result Value Range Status   Specimen Description URINE, CLEAN CATCH   Final   Special Requests none Normal   Final   Culture  Setup Time 10/23/2012 14:43   Final   Colony Count 50,000 COLONIES/ML   Final   Culture ENTEROCOCCUS SPECIES   Final   Report Status PENDING   Incomplete     Studies: No results found.  Scheduled Meds: . Chlorhexidine Gluconate Cloth  6 each Topical Q0600  . citalopram  40 mg Oral Daily  . enoxaparin (LOVENOX) injection  30 mg Subcutaneous Q24H  . feeding supplement  237 mL Oral BID BM  . mupirocin ointment  1 application Nasal BID  . nitrofurantoin (macrocrystal-monohydrate)  100 mg Oral Daily  . pantoprazole  40 mg Oral Q1200  . senna-docusate  1 tablet Oral BID   Continuous Infusions:   Principal Problem:   Traumatic cerebral intraparenchymal hematoma Active Problems:   Hyponatremia   Restless leg   Urethral stenosis   Recurrent UTI   Metabolic encephalopathy   Recurrent falls   Weight loss, non-intentional   Duodenum disorder   Iron deficiency anemia   Nonspecific (abnormal) findings on radiological and other examination of gastrointestinal tract   Reflux esophagitis    Time spent: 25 minutes    [name],[name]  [facility] Pager [phone]  If 7PM-7AM, please contact night-coverage at [email or web address], password [facility] 10/25/2012, 11:15 AM  LOS: 4 days

## 2012-10-25 NOTE — Progress Notes (Signed)
Stroke Team Progress Note  HISTORY Nicole Sparks is a 79 y.o. female who had not been doing well for the past 3 weeks or so prior to admission. The patient was diagnosed with a urinary tract infection and had poor balance for that time. She had been through multiple antibiotic regimens. Due to her poor balance during this period of time she had multiple falls. On 10/21/12 she felt that she was dizzy and had more difficulty with her gait. She called her daughter and was brought in for evaluation at that time.   Date last known well: Unable to determine  Time last known well: Unable to determine  tPA Given: No: ICH  SUBJECTIVE No family members present. The patient is without complaints other than some mild right flank tenderness possibly related to her urinary tract infection. We briefly discussed the results of her upper endoscopy. The patient would like to shower today. I recommended that she get up only with assistance. Anticipate possible rehabilitation admission soon.   OBJECTIVE Most recent Vital Signs: Filed Vitals:   10/24/12 1800 10/24/12 2200 10/25/12 0200 10/25/12 0600  BP: 113/61 145/70 126/70 140/68  Pulse: 88 67 62 62  Temp: 98.1 F (36.7 C) 98.3 F (36.8 C) 97.8 F (36.6 C) 98.1 F (36.7 C)  TempSrc: Oral     Resp: 18 14 18 18   Height:      Weight:      SpO2: 97% 97% 98% 98%   CBG (last 3)  No results found for this basename: GLUCAP,  in the last 72 hours  IV Fluid Intake:     MEDICATIONS  . Chlorhexidine Gluconate Cloth  6 each Topical Q0600  . citalopram  40 mg Oral Daily  . enoxaparin (LOVENOX) injection  30 mg Subcutaneous Q24H  . feeding supplement  237 mL Oral BID BM  . mupirocin ointment  1 application Nasal BID  . nitrofurantoin (macrocrystal-monohydrate)  100 mg Oral Daily  . pantoprazole  40 mg Oral Q1200  . senna-docusate  1 tablet Oral BID   PRN:  acetaminophen, acetaminophen, alum & mag hydroxide-simeth, labetalol, ondansetron (ZOFRAN) IV,  traMADol  Diet:  General thin liquids Activity:   Up with assistance DVT Prophylaxis:  SCD & Lovenox  CLINICALLY SIGNIFICANT STUDIES Basic Metabolic Panel:   Recent Labs Lab 10/23/12 0536 10/24/12 0620  NA 133* 133*  K 4.1 4.2  CL 99 98  CO2 27 24  GLUCOSE 101* 99  BUN 12 17  CREATININE 0.92 0.99  CALCIUM 9.2 8.7   Liver Function Tests:   Recent Labs Lab 10/21/12 1745  AST 20  ALT 16  ALKPHOS 67  BILITOT 0.1*  PROT 7.0  ALBUMIN 3.3*   CBC:   Recent Labs Lab 10/21/12 1745 10/23/12 0536 10/24/12 0620  WBC 6.9 5.7 5.6  NEUTROABS 4.1  --   --   HGB 10.8* 10.1* 10.2*  HCT 33.9* 32.2* 32.2*  MCV 81.3 81.3 80.5  PLT 316 263 268   Coagulation: No results found for this basename: LABPROT, INR,  in the last 168 hours Cardiac Enzymes: No results found for this basename: CKTOTAL, CKMB, CKMBINDEX, TROPONINI,  in the last 168 hours Urinalysis:   Recent Labs Lab 10/21/12 1811  COLORURINE YELLOW  LABSPEC 1.012  PHURINE 6.5  GLUCOSEU NEGATIVE  HGBUR SMALL*  BILIRUBINUR NEGATIVE  KETONESUR NEGATIVE  PROTEINUR NEGATIVE  UROBILINOGEN 0.2  NITRITE POSITIVE*  LEUKOCYTESUR NEGATIVE   Lipid Panel No results found for this basename: chol,  trig,  hdl,  cholhdl,  vldl,  ldlcalc   HgbA1C  No results found for this basename: HGBA1C   Urine Drug Screen:   No results found for this basename: labopia,  cocainscrnur,  labbenz,  amphetmu,  thcu,  labbarb    Alcohol Level: No results found for this basename: ETH,  in the last 168 hours  Ct Head Wo Contrast 10/21/2012   Hyperdense 15 mm lesion right high parietal lobe with surrounding edema.  This may represent a metastatic disease containing acute hemorrhage.  This could be a hemorrhagic infarction or possibly an area resolving subacute hemorrhage.  Overall I would favor hemorrhagic metastatic disease.  Further evaluation with MRI brain without and with contrast is suggested.   Mr Lodema Pilot Contrast 10/21/2012  The  abnormality at CT represents a late subacute intraparenchymal hematoma.  There is mild regional vasogenic edema but no mass effect of significance.  Very thin amount of subdural blood along the right posterior falx.  The findings could be post-traumatic or they could represent an intraparenchymal hemorrhage which penetrated into the subdural space with the underlying etiology been either hypertension or amyloid angiopathy.  Atrophy and chronic small vessel changes elsewhere throughout the brain.     CT Chest 10/22/2012 Large hiatal hernia. COPD with questionable 6 mm right apex nodule and tiny bilateral pleural effusions.  Osseous demineralization with old bilateral rib fractures. Age indeterminate fractures left 6th rib and the superior plate T8.  CT Abd/Pelvis 5/22/2014Left hydronephrosis and hydroureter appears to be due to obstruction at the anastomoses with the urinary diversion question stricture. Colonic diverticulosis without evidence of diverticulitis. Duodenal  wall thickening, could potentially be an artifact from underdistension but other etiologies including duodenitis/inflammation, infection, or less likely tumor not completely excluded. Large hiatal hernia.  EKG     Therapy Recommendations CIR  Physical Exam   Mental Status:  Alert, oriented, thought content appropriate. Speech fluent without evidence of aphasia. Able to follow 3 step commands without difficulty.  Cranial Nerves:  II: Visual fields grossly normal, pupils equal, round, reactive to light and accommodation  III,IV, VI: ptosis not present, extra-ocular motions intact bilaterally  V,VII: smile symmetric, facial light touch sensation normal bilaterally  VIII: hearing normal bilaterally  IX,X: gag reflex present  XI: bilateral shoulder shrug  XII: midline tongue extension  Motor:  Right : Upper extremity 5/5 Left: Upper extremity 5/5  Lower extremity 5/5 Lower extremity 5/5  Tone and bulk:normal tone throughout; no  atrophy noted. Positive palmomental.  Sensory: Pinprick and light touch intact throughout, bilaterally  Deep Tendon Reflexes: 2+ and symmetric throughout  Cerebellar:  normal finger-to-nose  Gait/station: Deferred at this time.   ASSESSMENT Nicole Sparks is a 79 y.o. female presenting with dizziness and gait disorder. Imaging confirms a right parietal subacute intraparenchmymal hemotoma due to hypertension, amyloid angiopathy or metastatic disease. Has some cytotoxic edema and small SDH which is likely related to recent fall. On no antithrombotics prior to admission. Patient with resultant dizziness and gait abnormality. Rehab work up underway.   Abnormal urine -. See below  Passed swallow screen  Unintentional weight loss - suspicious for cancer - 45 pound weight loss in 5 months.   Recent fall  Anemia  Cytotoxic edema  Hospital day # 4  TREATMENT/PLAN  No antiplatelets or anticoagulants given spontaneous hemorrhage  F/u Urine culture reveals 50,000 colonies of enterococcus - final report pending  Lovenox for VTE prophylaxis  Cancer workup by medicine  CIR recommended at discharge  Upper endoscopy yesterday - esophagitis and hiatal hernia found - no tumor  Delton See PA-C [facility] Pager [phone] 10/25/2012, 7:54 AM  I have personally obtained a history, examined the patient, evaluated imaging results, and formulated the assessment and plan of care. I agree with the above. Right parietal ICH with no clear etiology. Needs follow up MRI brain (w/wo) in 1-2 months and then follow up in stroke clinic (Dr. Pearlean Brownie, [facility] [phone]) in 2-3 months. Stroke team will sign off. Call with questions.   Suanne Marker, MD 10/25/2012, 3:10 PM Certified in Neurology, Neurophysiology and Neuroimaging [facility] - Stroke Team  Please refer to [email or web address] for on-call Stroke MD

## 2012-10-26 ENCOUNTER — Inpatient Hospital Stay (HOSPITAL_COMMUNITY)
Admission: RE | Admit: 2012-10-26 | Discharge: 2012-11-04 | DRG: 945 | Disposition: A | Discharge status: Home Health | Payer: Medicare Other | Source: Intra-hospital | Attending: Physical Medicine & Rehabilitation | Admitting: Physical Medicine & Rehabilitation

## 2012-10-26 DIAGNOSIS — N390 Urinary tract infection, site not specified: Secondary | ICD-10-CM

## 2012-10-26 DIAGNOSIS — Y92009 Unspecified place in unspecified non-institutional (private) residence as the place of occurrence of the external cause: Secondary | ICD-10-CM

## 2012-10-26 DIAGNOSIS — N1330 Unspecified hydronephrosis: Secondary | ICD-10-CM

## 2012-10-26 DIAGNOSIS — Z22322 Carrier or suspected carrier of Methicillin resistant Staphylococcus aureus: Secondary | ICD-10-CM

## 2012-10-26 DIAGNOSIS — W19XXXA Unspecified fall, initial encounter: Secondary | ICD-10-CM

## 2012-10-26 DIAGNOSIS — IMO0002 Reserved for concepts with insufficient information to code with codable children: Secondary | ICD-10-CM

## 2012-10-26 DIAGNOSIS — S065X0A Traumatic subdural hemorrhage without loss of consciousness, initial encounter: Secondary | ICD-10-CM

## 2012-10-26 DIAGNOSIS — D509 Iron deficiency anemia, unspecified: Secondary | ICD-10-CM

## 2012-10-26 DIAGNOSIS — I619 Nontraumatic intracerebral hemorrhage, unspecified: Secondary | ICD-10-CM

## 2012-10-26 DIAGNOSIS — J4489 Other specified chronic obstructive pulmonary disease: Secondary | ICD-10-CM

## 2012-10-26 DIAGNOSIS — G2581 Restless legs syndrome: Secondary | ICD-10-CM

## 2012-10-26 DIAGNOSIS — F3289 Other specified depressive episodes: Secondary | ICD-10-CM

## 2012-10-26 DIAGNOSIS — M1710 Unilateral primary osteoarthritis, unspecified knee: Secondary | ICD-10-CM

## 2012-10-26 DIAGNOSIS — Z5189 Encounter for other specified aftercare: Principal | ICD-10-CM

## 2012-10-26 DIAGNOSIS — F329 Major depressive disorder, single episode, unspecified: Secondary | ICD-10-CM

## 2012-10-26 DIAGNOSIS — Z9181 History of falling: Secondary | ICD-10-CM

## 2012-10-26 DIAGNOSIS — R634 Abnormal weight loss: Secondary | ICD-10-CM

## 2012-10-26 DIAGNOSIS — K5730 Diverticulosis of large intestine without perforation or abscess without bleeding: Secondary | ICD-10-CM

## 2012-10-26 DIAGNOSIS — Z87891 Personal history of nicotine dependence: Secondary | ICD-10-CM

## 2012-10-26 DIAGNOSIS — R29898 Other symptoms and signs involving the musculoskeletal system: Secondary | ICD-10-CM

## 2012-10-26 DIAGNOSIS — G4700 Insomnia, unspecified: Secondary | ICD-10-CM

## 2012-10-26 DIAGNOSIS — J449 Chronic obstructive pulmonary disease, unspecified: Secondary | ICD-10-CM

## 2012-10-26 LAB — CBC
HCT: 35 % — ABNORMAL LOW (ref 36.0–46.0)
MCH: 25.9 pg — ABNORMAL LOW (ref 26.0–34.0)
MCHC: 31.7 g/dL (ref 30.0–36.0)
MCV: 81.8 fL (ref 78.0–100.0)
RDW: 16.9 % — ABNORMAL HIGH (ref 11.5–15.5)

## 2012-10-26 MED ORDER — PANTOPRAZOLE SODIUM 40 MG PO TBEC
40.0000 mg | DELAYED_RELEASE_TABLET | Freq: Every day | ORAL | Status: DC
  Administered 2012-10-27 – 2012-11-03 (×8): 40 mg via ORAL
  Filled 2012-10-26 (×8): qty 1

## 2012-10-26 MED ORDER — ENOXAPARIN SODIUM 30 MG/0.3ML ~~LOC~~ SOLN: 30.0000 mg | SUBCUTANEOUS | Status: DC

## 2012-10-26 MED ORDER — ENOXAPARIN SODIUM 30 MG/0.3ML ~~LOC~~ SOLN
30.0000 mg | SUBCUTANEOUS | Status: DC
  Administered 2012-10-27 – 2012-11-04 (×9): 30 mg via SUBCUTANEOUS
  Filled 2012-10-26 (×9): qty 0.3

## 2012-10-26 MED ORDER — SENNOSIDES-DOCUSATE SODIUM 8.6-50 MG PO TABS
1.0000 | ORAL_TABLET | Freq: Two times a day (BID) | ORAL | Status: DC
  Administered 2012-10-26 – 2012-11-04 (×12): 1 via ORAL
  Filled 2012-10-26 (×16): qty 1

## 2012-10-26 MED ORDER — ONDANSETRON HCL 4 MG/2ML IJ SOLN: 4.0000 mg | Freq: Four times a day (QID) | INTRAMUSCULAR | Status: DC | PRN

## 2012-10-26 MED ORDER — CITALOPRAM HYDROBROMIDE 40 MG PO TABS
40.0000 mg | ORAL_TABLET | Freq: Every day | ORAL | Status: DC
Start: 2012-10-27 — End: 2012-11-04
  Administered 2012-10-27 – 2012-11-04 (×9): 40 mg via ORAL
  Filled 2012-10-26 (×11): qty 1

## 2012-10-26 MED ORDER — ALUM & MAG HYDROXIDE-SIMETH 200-200-20 MG/5ML PO SUSP
30.0000 mL | Freq: Three times a day (TID) | ORAL | Status: DC | PRN
  Administered 2012-11-01: 30 mL via ORAL
  Filled 2012-10-26 (×3): qty 30

## 2012-10-26 MED ORDER — MUPIROCIN 2 % EX OINT
1.0000 "application " | TOPICAL_OINTMENT | Freq: Two times a day (BID) | CUTANEOUS | Status: AC
  Administered 2012-10-26 – 2012-10-31 (×10): 1 via NASAL
  Filled 2012-10-26 (×2): qty 22

## 2012-10-26 MED ORDER — ACETAMINOPHEN 325 MG PO TABS
325.0000 mg | ORAL_TABLET | ORAL | Status: DC | PRN
  Administered 2012-10-27 – 2012-11-04 (×11): 650 mg via ORAL
  Filled 2012-10-26 (×11): qty 2

## 2012-10-26 MED ORDER — NITROFURANTOIN MONOHYD MACRO 100 MG PO CAPS
100.0000 mg | ORAL_CAPSULE | Freq: Every day | ORAL | Status: DC
  Administered 2012-10-27 – 2012-11-04 (×9): 100 mg via ORAL
  Filled 2012-10-26 (×10): qty 1

## 2012-10-26 MED ORDER — TRAMADOL HCL 50 MG PO TABS
50.0000 mg | ORAL_TABLET | ORAL | Status: DC | PRN
  Administered 2012-10-27 – 2012-11-03 (×11): 50 mg via ORAL
  Filled 2012-10-26 (×11): qty 1

## 2012-10-26 MED ORDER — ONDANSETRON HCL 4 MG PO TABS: 4.0000 mg | ORAL_TABLET | Freq: Four times a day (QID) | ORAL | Status: DC | PRN

## 2012-10-26 NOTE — Discharge Summary (Signed)
Physician Discharge Summary  Nicole Sparks ZOX:[id number] DOB: 1931-12-21 DOA: 10/21/2012  PCP: Lolita Patella, MD  Admit date: 10/21/2012 Discharge date: 10/26/2012  Time spent: Greater than 30  minutes  Recommendations for Outpatient Follow-up:  -To CIR.   Discharge Diagnoses:  Principal Problem:   Traumatic cerebral intraparenchymal hematoma Active Problems:   Hyponatremia   Restless leg   Urethral stenosis   Recurrent UTI   Metabolic encephalopathy   Recurrent falls   Weight loss, non-intentional   Duodenum disorder   Iron deficiency anemia   Nonspecific (abnormal) findings on radiological and other examination of gastrointestinal tract   Reflux esophagitis   Discharge Condition: Stable and improved  Filed Weights   10/21/12 1633 10/21/12 2255  Weight: 53.524 kg (118 lb) 53.7 kg (118 lb 6.2 oz)    History of present illness:  Patient is a 79 y.o. female who has not been doing well for the past 3 weeks or so. The patient was diagnosed with a urinary tract infection and has had poor balance for that time. She has been through multiple antibiotic regimens. Due to her poor balance during this period of time she has had multiple falls. Today felt that she was dizzy and had more difficulty with gait. Called her daughter and was brought in for evaluation at that time.  We were asked to admit her for further evaluation and management.   Hospital Course:   Traumatic cerebral intraparenchymal hematoma  -Related to falls.  -No further neurologic deficits.  -Neurology following.   Recurrent falls  -previously lived alone (husband died in past 12 months) but now resides with daughter who is case Production designer, theatre/television/film with [employer]  -plan is for CIR   Hyponatremia  -Stable at 133.   Recurrent UTI/history of Urethral stenosis/urostomy  -followed by [name] as OP  -on Macrodantin chronically pre admit  -Cx with 50,000 enterococcus sensitive to macrobid. She is already on  this for chronic suppression.   Metabolic encephalopathy  -resolved and likely due to low sodium and head trauma   Duodenal thickening  -wall thickening seen on CT scan  -no postprandial or early satiety sx's reported by pt when questioned  -EGD without duodenal abnormalities.  -Does have some esophagitis for which PPI as have been recommended.   Restless leg   Weight loss, non-intentional  -pt attributes to increased personal stressors (spouse died past 12 months)  -basically not hungry (see above)     Procedures:  None   Consultations:  Gi, Dr. Juanda Chance  Discharge Instructions     Medication List    TAKE these medications       albuterol (2.5 MG/3ML) 0.083% nebulizer solution  Commonly known as:  PROVENTIL  Take 2.5 mg by nebulization every 4 (four) hours as needed for wheezing or shortness of breath.     ALPRAZolam 0.5 MG tablet  Commonly known as:  XANAX  Take 0.5 mg by mouth at bedtime.     BENGAY EX  Apply 1 application topically at bedtime as needed (for knee/back pain).     budesonide-formoterol 80-4.5 MCG/ACT inhaler  Commonly known as:  SYMBICORT  Inhale 2 puffs into the lungs 2 (two) times daily as needed (for shortnes of breath).     calcium-vitamin D 500-200 MG-UNIT per tablet  Commonly known as:  OSCAL WITH D  Take 1 tablet by mouth 2 (two) times daily.     cetirizine 10 MG tablet  Commonly known as:  ZYRTEC  Take 10 mg by mouth  daily as needed for allergies.     citalopram 40 MG tablet  Commonly known as:  CELEXA  Take 40 mg by mouth daily.     multivitamin with minerals Tabs  Take 1 tablet by mouth daily.     nitrofurantoin 100 MG capsule  Commonly known as:  MACRODANTIN  Take 100 mg by mouth 2 (two) times daily. Taking until 10/23/12, then taper down to 1 cap daily.     omeprazole 20 MG capsule  Commonly known as:  PRILOSEC  Take 20 mg by mouth daily as needed (for heartburn).     traMADol 50 MG tablet  Commonly known as:  ULTRAM   Take 50 mg by mouth every 4 (four) hours as needed for pain.       Allergies  Allergen Reactions  . Sudafed (Pseudoephedrine)     Passes out      The results of significant diagnostics from this hospitalization (including imaging, microbiology, ancillary and laboratory) are listed below for reference.    Significant Diagnostic Studies: Ct Head Wo Contrast  10/21/2012   *RADIOLOGY REPORT*  Clinical Data: Weakness.  UTI  CT HEAD WITHOUT CONTRAST  Technique:  Contiguous axial images were obtained from the base of the skull through the vertex without contrast.  Comparison: None  Findings: Generalized atrophy with prominent ventricles and subarachnoid space.  Chronic microvascular ischemic changes in the white matter.  15 mm lesion in the high right parietal lobe is mildly hyper dense compared to brain.  There is surrounding vasogenic edema.  Negative for subarachnoid or subdural hemorrhage.  No shift to the midline structures.  IMPRESSION: Hyperdense 15 mm lesion right high parietal lobe with surrounding edema.  This may represent a metastatic disease containing acute hemorrhage.  This could be a hemorrhagic infarction or possibly an area resolving subacute hemorrhage.  Overall I would favor hemorrhagic metastatic disease.  Further evaluation with MRI brain without and with contrast is suggested.  I discussed the findings by telephone with Dr. Konrad Dolores   Original Report Authenticated By: Janeece Riggers, M.D.   Ct Chest W Contrast  10/22/2012   *RADIOLOGY REPORT*  Clinical Data:  40 pounds weight loss, diarrhea, history of urostomy, asthma, COPD, smoking  CT CHEST, ABDOMEN AND PELVIS WITH CONTRAST  Technique:  Multidetector CT imaging of the chest, abdomen and pelvis was performed following the standard protocol during bolus administration of intravenous contrast.  Sagittal and coronal MPR images reconstructed from axial data set.  Contrast: 80mL OMNIPAQUE IOHEXOL 300 MG/ML  SOLN Dilute oral contrast.   Comparison:  None  CT CHEST  Findings: Scattered atherosclerotic calcifications aorta and coronary arteries. No thoracic adenopathy. Large hiatal hernia. Changes of COPD without infiltrate or pneumothorax. Tiny nodular focus versus scarring at right apex 6 mm diameter image 7. Tiny bilateral pleural effusions larger on the left. Old fractures right fifth and sixth ribs anterolaterally. Age indeterminate left anterior sixth rib fracture, favor acute versus subacute. Old fractures posterior left eleventh and twelfth ribs. Superior plate compression fracture T8 age indeterminate.  IMPRESSION: Large hiatal hernia. COPD with questionable 6 mm right apex nodule and tiny bilateral pleural effusions. Osseous demineralization with old bilateral rib fractures. Age indeterminate fractures left 6th rib and the superior plate T8.  CT ABDOMEN AND PELVIS  Findings: Marked left hydronephrosis and hydroureter extending to urinary diversion in the central pelvis, consistent with an anastomotic stricture. Associated cortical thinning/cortical loss left kidney. No right hydronephrosis seen. Nonspecific 8 mm low attenuation focus in liver  anteriorly image 57. Remainder of liver, spleen, pancreas, kidneys, and adrenal glands normal appearance. Urinary diversion decompressed, extends to the urostomy in the upper right pelvis.  Scattered colonic diverticulosis without definite evidence of acute diverticulitis. Duodenal wall thickening. Remaining bowel loops unremarkable. No mass, adenopathy, free fluid or inflammatory process. Degenerative changes of bilateral hip joints.  IMPRESSION: Left hydronephrosis and hydroureter appears to be due to obstruction at the anastomoses with the urinary diversion question stricture. Colonic diverticulosis without evidence of diverticulitis. Duodenal wall thickening, could potentially be an artifact from underdistension but other etiologies including duodenitis/inflammation, infection, or less likely tumor  not completely excluded. Large hiatal hernia.   Original Report Authenticated By: Ulyses Southward, M.D.   Mr Laqueta Jean ZO Contrast  10/21/2012   *RADIOLOGY REPORT*  Clinical Data: Weakness.  Abnormal head CT.  MRI HEAD WITHOUT AND WITH CONTRAST  Technique:  Multiplanar, multiecho pulse sequences of the brain and surrounding structures were obtained according to standard protocol without and with intravenous contrast  Contrast: 10mL MULTIHANCE GADOBENATE DIMEGLUMINE 529 MG/ML IV SOLN  Comparison: Head CT same day  Findings: There is a 3.8 x 2.2 x 2.8 cm in diameter late subacute intraparenchymal hematoma in the medial right parietal lobe corresponding to the CT abnormality.  There is typical marginal enhancement.  There is some regional vasogenic edema.  There is a small amount of subdural blood along the right side of the posterior falx, no more than 1 mm in thickness.  There are chronic small vessel changes affecting the pons.  There is cerebellar atrophy but no focal cerebellar insult.  There are dilated perivascular spaces throughout the cerebral hemispheres. There are mild chronic small vessel changes within the deep white matter.  No evidence of neoplastic mass lesion.  No sign of acute hemorrhage.  No hydrocephalus.  No pituitary lesion.  No skull or skull base lesion.  IMPRESSION: The abnormality at CT represents a late subacute intraparenchymal hematoma.  There is mild regional vasogenic edema but no mass effect of significance.  Very thin amount of subdural blood along the right posterior falx.  The findings could be post-traumatic or they could represent an intraparenchymal hemorrhage which penetrated into the subdural space with the underlying etiology been either hypertension or amyloid angiopathy.  Atrophy and chronic small vessel changes elsewhere throughout the brain.   Original Report Authenticated By: Paulina Fusi, M.D.   Ct Abdomen Pelvis W Contrast  10/22/2012   *RADIOLOGY REPORT*  Clinical Data:   40 pounds weight loss, diarrhea, history of urostomy, asthma, COPD, smoking  CT CHEST, ABDOMEN AND PELVIS WITH CONTRAST  Technique:  Multidetector CT imaging of the chest, abdomen and pelvis was performed following the standard protocol during bolus administration of intravenous contrast.  Sagittal and coronal MPR images reconstructed from axial data set.  Contrast: 80mL OMNIPAQUE IOHEXOL 300 MG/ML  SOLN Dilute oral contrast.  Comparison:  None  CT CHEST  Findings: Scattered atherosclerotic calcifications aorta and coronary arteries. No thoracic adenopathy. Large hiatal hernia. Changes of COPD without infiltrate or pneumothorax. Tiny nodular focus versus scarring at right apex 6 mm diameter image 7. Tiny bilateral pleural effusions larger on the left. Old fractures right fifth and sixth ribs anterolaterally. Age indeterminate left anterior sixth rib fracture, favor acute versus subacute. Old fractures posterior left eleventh and twelfth ribs. Superior plate compression fracture T8 age indeterminate.  IMPRESSION: Large hiatal hernia. COPD with questionable 6 mm right apex nodule and tiny bilateral pleural effusions. Osseous demineralization with old bilateral rib  fractures. Age indeterminate fractures left 6th rib and the superior plate T8.  CT ABDOMEN AND PELVIS  Findings: Marked left hydronephrosis and hydroureter extending to urinary diversion in the central pelvis, consistent with an anastomotic stricture. Associated cortical thinning/cortical loss left kidney. No right hydronephrosis seen. Nonspecific 8 mm low attenuation focus in liver anteriorly image 57. Remainder of liver, spleen, pancreas, kidneys, and adrenal glands normal appearance. Urinary diversion decompressed, extends to the urostomy in the upper right pelvis.  Scattered colonic diverticulosis without definite evidence of acute diverticulitis. Duodenal wall thickening. Remaining bowel loops unremarkable. No mass, adenopathy, free fluid or  inflammatory process. Degenerative changes of bilateral hip joints.  IMPRESSION: Left hydronephrosis and hydroureter appears to be due to obstruction at the anastomoses with the urinary diversion question stricture. Colonic diverticulosis without evidence of diverticulitis. Duodenal wall thickening, could potentially be an artifact from underdistension but other etiologies including duodenitis/inflammation, infection, or less likely tumor not completely excluded. Large hiatal hernia.   Original Report Authenticated By: Ulyses Southward, M.D.    Microbiology: Recent Results (from the past 240 hour(s))  MRSA PCR SCREENING     Status: Abnormal   Collection Time    10/21/12 10:52 PM      Result Value Range Status   MRSA by PCR POSITIVE (*) NEGATIVE Final   Comment:            The GeneXpert MRSA Assay (FDA     approved for NASAL specimens     only), is one component of a     comprehensive MRSA colonization     surveillance program. It is not     intended to diagnose MRSA     infection nor to guide or     monitor treatment for     MRSA infections.     RESULT CALLED TO, READ BACK BY AND VERIFIED WITH:     MUSIAL,B RN 10/22/2012 0301 JORDANS  URINE CULTURE     Status: None   Collection Time    10/23/12  9:21 AM      Result Value Range Status   Specimen Description URINE, CLEAN CATCH   Final   Special Requests none Normal   Final   Culture  Setup Time 10/23/2012 14:43   Final   Colony Count 50,000 COLONIES/ML   Final   Culture ENTEROCOCCUS SPECIES   Final   Report Status 10/25/2012 FINAL   Final   Organism ID, Bacteria ENTEROCOCCUS SPECIES   Final     Labs: Basic Metabolic Panel:  Recent Labs Lab 10/21/12 1745 10/23/12 0536 10/24/12 0620  NA 128* 133* 133*  K 4.6 4.1 4.2  CL 94* 99 98  CO2 26 27 24   GLUCOSE 98 101* 99  BUN 17 12 17   CREATININE 1.02 0.92 0.99  CALCIUM 9.1 9.2 8.7   Liver Function Tests:  Recent Labs Lab 10/21/12 1745  AST 20  ALT 16  ALKPHOS 67  BILITOT  0.1*  PROT 7.0  ALBUMIN 3.3*   No results found for this basename: LIPASE, AMYLASE,  in the last 168 hours No results found for this basename: AMMONIA,  in the last 168 hours CBC:  Recent Labs Lab 10/21/12 1745 10/23/12 0536 10/24/12 0620  WBC 6.9 5.7 5.6  NEUTROABS 4.1  --   --   HGB 10.8* 10.1* 10.2*  HCT 33.9* 32.2* 32.2*  MCV 81.3 81.3 80.5  PLT 316 263 268   Cardiac Enzymes: No results found for this basename: CKTOTAL, CKMB, CKMBINDEX, TROPONINI,  in the last 168 hours BNP: BNP (last 3 results) No results found for this basename: PROBNP,  in the last 8760 hours CBG: No results found for this basename: GLUCAP,  in the last 168 hours     Signed:  Chaya Jan  [facility] Pager: [phone] 10/26/2012, 10:28 AM

## 2012-10-26 NOTE — Plan of Care (Signed)
Overall Plan of Care [facility]) Patient Details Name: Nicole Sparks MRN: [id number] DOB: 08/03/1931  Diagnosis: ICH, TBI   Co-morbidities: dementia, depression  Functional Problem List  Patient demonstrates impairments in the following areas: Balance, Bladder, Bowel, Cognition, Edema, Endurance, Motor and Safety  Basic ADL's: eating, grooming, bathing, dressing and toileting Advanced ADL's: simple meal preparation, full meal preparation and laundry  Transfers:  bed mobility, bed to chair, toilet, tub/shower, car and furniture Locomotion:  ambulation and stairs  Additional Impairments:  Social Cognition   problem solving, memory, attention and awareness  Anticipated Outcomes Item Anticipated Outcome  Eating/Swallowing    Basic self-care  supervision  Tolieting  supervision  Bowel/Bladder  Min assist  Transfers  supervision  Locomotion  supervision  Communication    Cognition  Supervision  Pain  independent  Safety/Judgment  Supervision  Other     Therapy Plan: PT Intensity: Minimum of 1-2 x/day ,45 to 90 minutes PT Frequency: 5 out of 7 days PT Duration Estimated Length of Stay: 7-10 days OT Intensity: Minimum of 1-2 x/day, 45 to 90 minutes OT Frequency: 5 out of 7 days OT Duration/Estimated Length of Stay: 7-10 days SLP Intensity: Minumum of 1-2 x/day, 30 to 90 minutes SLP Frequency: 5 out of 7 days SLP Duration/Estimated Length of Stay: 10-12 days    Team Interventions: Item RN PT OT SLP SW TR Other  Self Care/Advanced ADL Retraining   x      Neuromuscular Re-Education  x xx      Therapeutic Activities  x x x     UE/LE Strength Training/ROM  x x      UE/LE Coordination Activities  x x      Visual/Perceptual Remediation/Compensation         DME/Adaptive Equipment Instruction  x x      Therapeutic Exercise  x x      Balance/Vestibular Training  x x      Patient/Family Education X x x x     Cognitive Remediation/Compensation  x x x     Functional  Mobility Training  x x      Ambulation/Gait Training  x       Stair Training  x       Wheelchair Propulsion/Positioning  x       Functional Tourist information centre manager Reintegration  x x      Dysphagia/Aspiration Film/video editor         Bladder Management X        Bowel Management X        Disease Management/Prevention         Pain Management X x x      Medication Management X        Skin Care/Wound Management X        Splinting/Orthotics  x       Discharge Planning  x x x     Psychosocial Support    x                            Team Discharge Planning: Destination: PT-Home ,OT- Home , SLP-Home Projected Follow-up: PT-Outpatient PT, OT-  Home health OT, SLP-Outpatient SLP Projected Equipment Needs: PT- , OT-  , SLP-None recommended by SLP Patient/family involved in discharge planning: PT- Patient,  OT-Patient, SLP-Patient  MD ELOS: 7-10 days Medical Rehab Prognosis:  Good Assessment: The patient has been admitted for CIR therapies. The team will be addressing, functional mobility, strength, stamina, balance, safety, adaptive techniques/equipment, self-care, bowel and bladder mgt, patient and caregiver education, cognitive perceptual rx, NMR. Goals have been set at supervision generally for mobility, self-care, cognition.    Ranelle Oyster, MD, FAAPMR      See Team Conference Notes for weekly updates to the plan of care

## 2012-10-26 NOTE — Progress Notes (Signed)
Report called to Rn on 4000,will get patient ready to be transferred. Assessments remained unchanged as at now.

## 2012-10-26 NOTE — Progress Notes (Signed)
Patient to be admitted to inpt rehab today. Patient is in agreement. I have contacted her daughter, Nicole Sparks , by phone and she is in agreement. Dr. Ardyth Harps is also aware. [phone]

## 2012-10-26 NOTE — PMR Pre-admission (Signed)
PMR Admission Coordinator Pre-Admission Assessment  Patient: Nicole Sparks is an 79 y.o., female MRN: [id number] DOB: Oct 18, 1931 Height: 5\' 2"  (157.5 cm) Weight: 53.7 kg (118 lb 6.2 oz)              Insurance Information HMO:     PPO:      PCP:      IPA:      80/20: yes     OTHER: no HMO PRIMARY: medicare A and B      Policy#: [id number]      Subscriber: pt CM Name:       Phone#:      Fax#:  Pre-Cert#:       Employer:  Benefits:  Phone #: [employer]     Name: 10/22/12 Eff. Date: 09/02/98     Deduct: 1214/11/08      Out of Pocket Max: none      Life Max: none CIR: 100%      SNF: 20 full days LBD 04/11/12 Outpatient: 80%     Co-Pay: 20% Home Health: 100%      Co-Pay: none DME: 80%     Co-Pay: 20% Providers:  In network  SECONDARY: none per daughter, pt states she has a BCBS supplement that pays for meds        Emergency Contact Information Contact Information   Name Relation Home Work [place] Daughter [phone]     Current Medical History  Patient Admitting Diagnosis: Right parietal intraparenchymal hemorrhage, small SDH (likely traumatic  History of Present Illness: HPI: Nicole Sparks is a 79 y.o. right handed to female with history of COPD. By report patient not doing well for the past 3 weeks with multiple falls and poor balance as well as reported 45 pound weight loss over the last 5 months..The patient was diagnosed with a urinary tract infection and has been through multiple antibiotic regimens. On Macrodantin chronically pre admit.    Presented 10/21/2012 with dizziness and more difficulty with gait. MRI of the brain shows late subacute intraparenchymal hematoma. Patient did not receive TPA. Neurology services consulted  . No antiplatelets or anticoagulants given spontaneous hemorrhage. Lovenox for VTE prophylaxis. Needs follow up MRI  ( w/wo) in 1 to 2 months and then follow up in stroke clinic with Dr. Stann Mainland in 2 to 3 months.  Duodenal  wall thickening seen on CT scan. EGD without duodenal abnormalities. Does have some esophagitis for which PPI has been recommended. Patient attributes non-intentional weight loss to increased personal stressors and basically not hungry. States husband died in Nov 07, 2005 and she has moved in with her daughter in past 2 months and sold her home.  Past Medical History  Past Medical History  Diagnosis Date  . COPD (chronic obstructive pulmonary disease)   . Asthma   . Arthritis   . Renal disorder   . Renal insufficiency     Family History  family history is not on file.  Prior Rehab/Hospitalizations: none  Current Medications  Current facility-administered medications:acetaminophen (TYLENOL) suppository 650 mg, 650 mg, Rectal, Q4H PRN, Thana Farr, MD;  acetaminophen (TYLENOL) tablet 650 mg, 650 mg, Oral, Q4H PRN, Thana Farr, MD, 650 mg at 10/25/12 08-Nov-2102;  alum & mag hydroxide-simeth (MAALOX/MYLANTA) 200-200-20 MG/5ML suspension 30 mL, 30 mL, Oral, TID PRN, Henderson Cloud, MD, 30 mL at 10/25/12 1340 Chlorhexidine Gluconate Cloth 2 % PADS 6 each, 6 each, Topical, Q0600, Micki Riley, MD, 6 each at 10/26/12 0700;  citalopram (CELEXA)  tablet 40 mg, 40 mg, Oral, Daily, Thana Farr, MD, 40 mg at 10/25/12 1026;  enoxaparin (LOVENOX) injection 30 mg, 30 mg, Subcutaneous, Q24H, Micki Riley, MD, 30 mg at 10/25/12 1028 feeding supplement (ENSURE COMPLETE) liquid 237 mL, 237 mL, Oral, BID BM, Hettie Holstein, RD, 237 mL at 10/25/12 1340;  labetalol (NORMODYNE,TRANDATE) injection 10-40 mg, 10-40 mg, Intravenous, Q10 min PRN, Thana Farr, MD;  mupirocin ointment (BACTROBAN) 2 % 1 application, 1 application, Nasal, BID, Micki Riley, MD, 1 application at 10/25/12 2104 nitrofurantoin (macrocrystal-monohydrate) (MACROBID) capsule 100 mg, 100 mg, Oral, Daily, Henderson Cloud, MD, 100 mg at 10/25/12 1025;  ondansetron (ZOFRAN) injection 4 mg, 4 mg, Intravenous, Q6H PRN,  Thana Farr, MD;  pantoprazole (PROTONIX) EC tablet 40 mg, 40 mg, Oral, Q1200, Crystal Stillinger Merilynn Finland, RPH, 40 mg at 10/25/12 1113 senna-docusate (Senokot-S) tablet 1 tablet, 1 tablet, Oral, BID, Thana Farr, MD, 1 tablet at 10/25/12 2104;  traMADol (ULTRAM) tablet 50 mg, 50 mg, Oral, Q4H PRN, Thana Farr, MD, 50 mg at 10/25/12 2104  Patients Current Diet: General  Precautions / Restrictions Precautions Precautions: Fall Restrictions Weight Bearing Restrictions: No   Prior Activity Level Limited Community (1-2x/wk): Limited over the last two months; recent move from [place] to live with her daughter  Journalist, newspaper / Equipment Home Assistive Devices/Equipment: None Home Adaptive Equipment: None  Prior Functional Level Prior Function Level of Independence: Independent Able to Take Stairs?: Yes Driving: No (Has not been driving in the last two weeks per pt; question ) Vocation: Retired  Current Functional Level Cognition  Arousal/Alertness: Awake/alert Overall Cognitive Status: Impaired/Different from baseline Orientation Level: Oriented X4 Safety/Judgement: Decreased awareness of deficits General Comments: Pt demo'ing left inattention but denying that she does not have any problem (even though she was is running into objects during ambulation). Attention: Focused;Sustained;Selective Focused Attention: Appears intact Sustained Attention: Appears intact Selective Attention: Appears intact Memory: Impaired Memory Impairment: Storage deficit;Retrieval deficit Awareness: Impaired Awareness Impairment: Intellectual impairment;Emergent impairment Problem Solving: Impaired Problem Solving Impairment: Verbal complex;Functional basic Executive Function: Organizing;Sequencing;Reasoning Reasoning: Impaired Reasoning Impairment:  (slow requires moderate verbal cues and repetition) Sequencing: Impaired Sequencing Impairment: Functional complex Organizing:  Impaired Organizing Impairment: Functional complex Behaviors: Poor frustration tolerance Safety/Judgment: Impaired    Extremity Assessment (includes Sensation/Coordination)  RUE ROM/Strength/Tone: WFL for tasks assessed  RLE ROM/Strength/Tone: WFL for tasks assessed    ADLs  Grooming: Performed;Wash/dry hands;Min guard Where Assessed - Grooming: Unsupported standing Upper Body Bathing: Simulated;Supervision/safety Where Assessed - Upper Body Bathing: Unsupported sitting Lower Body Bathing: Simulated;Minimal assistance Where Assessed - Lower Body Bathing: Supported sit to stand Upper Body Dressing: Simulated;Set up Where Assessed - Upper Body Dressing: Unsupported sitting Lower Body Dressing: Simulated;Minimal assistance Where Assessed - Lower Body Dressing: Supported sit to stand Toilet Transfer: Performed;Moderate assistance Toilet Transfer Method:  (ambulating) Toilet Transfer Equipment: Regular height toilet;Grab bars Toileting - Clothing Manipulation and Hygiene: Performed;Minimal assistance Where Assessed - Toileting Clothing Manipulation and Hygiene: Sit to stand from 3-in-1 or toilet Equipment Used: Gait belt;Rolling walker Transfers/Ambulation Related to ADLs: Mod assist with RW. Assist for safety with RW and pt constantly veering left and bumping into objects on left. ADL Comments: Pt consistently throughout session requiring verbal and tactile cueing to locate objects on left side.   Pt very distracted during session asking every few minutes when she was going to MRI- very anxious.    Mobility  Bed Mobility: Supine to Sit;Sitting - Scoot to Edge of Bed;Sit to Supine  Supine to Sit: 5: Supervision;HOB elevated;With rails Sitting - Scoot to Edge of Bed: 5: Supervision Sit to Supine: 5: Supervision    Transfers  Transfers: Sit to Stand;Stand to Sit Sit to Stand: 4: Min guard;With upper extremity assist;From bed Stand to Sit: 4: Min guard;With upper extremity assist;To bed     Ambulation / Gait / Stairs / Wheelchair Mobility  Ambulation/Gait Ambulation/Gait Assistance: 4: Min assist;3: Mod assist Ambulation Distance (Feet): 200 Feet Assistive device: Rolling walker;1 person hand held assist Ambulation/Gait Assistance Details: Patient continues to require Min A with RW for RW management, especially in tight spaces and turns. Mod A with HHA as patient with decreased step length and when challenged with distractions patient looses balance and requires Mod A to correct Gait Pattern: Step-through pattern;Decreased step length - right;Decreased step length - left;Ataxic;Narrow base of support Gait velocity: decr General Gait Details: Amb around part of unit and then pt amb to bathroom and back.      Posture / Balance Static Sitting Balance Static Sitting - Balance Support: Feet supported;No upper extremity supported Static Sitting - Level of Assistance: 5: Stand by assistance Static Standing Balance Static Standing - Balance Support: Right upper extremity supported Static Standing - Level of Assistance: 5: Stand by assistance Static Standing - Comment/# of Minutes: Close guarding Dynamic Standing Balance Dynamic Standing - Balance Support: Right upper extremity supported;During functional activity Dynamic Standing - Level of Assistance: 4: Min assist    Special needs/care consideration Bowel mgmt:continent Bladder mgmt:urostomy since 2007    Previous Home Environment Living Arrangements: Children Lives With: Daughter Available Help at Discharge: Family;Available PRN/intermittently Type of Home: House Home Layout: One level Home Access: Stairs to enter Entrance Stairs-Rails: Right Entrance Stairs-Number of Steps: 4-5 Bathroom Shower/Tub: Health visitor: Standard Bathroom Accessibility: Yes How Accessible: Accessible via walker Home Care Services: No Additional Comments: spnnge baths mainly but showers once or twice per  week.urostomy  Discharge Living Setting Plans for Discharge Living Setting: Lives with (comment);Other (Comment) (daughter and her friend) Type of Home at Discharge: House Discharge Home Layout: One level Discharge Home Access: Stairs to enter Entrance Stairs-Number of Steps: 4 to 5 steps Discharge Bathroom Shower/Tub: Walk-in shower Discharge Bathroom Toilet: Standard Discharge Bathroom Accessibility: Yes How Accessible: Accessible via walker Do you have any problems obtaining your medications?: No  Social/Family/Support Systems Patient Roles: Parent Contact Information: Elizabella Nolet, daughter Anticipated Caregiver: [name] and [name] Anticipated Caregiver's Contact Information: see above Ability/Limitations of Caregiver: [name] is a Charity fundraiser CM at [employer] works days: [name] works at [employer]: 24/7 Discharge Plan Discussed with Primary Caregiver: Yes Is Caregiver In Agreement with Plan?: Yes Does Caregiver/Family have Issues with Lodging/Transportation while Pt is in Rehab?: No    Goals/Additional Needs Patient/Family Goal for Rehab: Mod I to superivison with PT, OT and SLP Expected length of stay: ELOS 7 to 10 days Dietary Needs: Very poor appetite and 45 pound weight loss in past 6 months Pt/Family Agrees to Admission and willing to participate: Yes Program Orientation Provided & Reviewed with Pt/Caregiver Including Roles  & Responsibilities: Yes   Decrease burden of Care through IP rehab admission: n/a  Possible need for SNF placement upon discharge:not anticipated   Patient Condition: This patient's medical and functional status has changed since the consult dated: 10/23/12 in which the Rehabilitation Physician determined and documented that the patient's condition is appropriate for intensive rehabilitative care in an inpatient rehabilitation facility. See "History of Present Illness" (above) for medical update.Medical  workup for non-intentional  weight loss completed 10/24/12 with GI workup and consultation.. Functional changes [id number] overall min to mod assist functionally. Patient's medical and functional status update has been discussed with the Rehabilitation physician and patient remains appropriate for inpatient rehabilitation. Will admit to inpatient rehab today.  Preadmission Screen Completed By:  Clois Dupes, 10/26/2012 9:20 AM ______________________________________________________________________   Discussed status with Dr. Riley Kill on 10/26/12 at  0935 and received telephone approval for admission today.  Admission Coordinator:  Clois Dupes, time 0981 Date 10/26/2012.

## 2012-10-26 NOTE — Progress Notes (Signed)
Physical Therapy Treatment Patient Details Name: Nicole Sparks MRN: [id number] DOB: [birth year] Today's Date: 10/26/2012 Time: 4098-1191 PT Time Calculation (min): 24 min  PT Assessment / Plan / Recommendation Comments on Treatment Session  Patient continues to make steady progress. Eager to ambulate. Awaiting CIR discision at this time    Follow Up Recommendations  CIR     Does the patient have the potential to tolerate intense rehabilitation     Barriers to Discharge        Equipment Recommendations  Rolling walker with 5" wheels    Recommendations for Other Services    Frequency Min 4X/week   Plan Discharge plan remains appropriate;Frequency remains appropriate    Precautions / Restrictions Precautions Precautions: Fall   Pertinent Vitals/Pain Denied pain    Mobility  Bed Mobility Supine to Sit: 5: Supervision;HOB elevated;With rails Sitting - Scoot to Edge of Bed: 5: Supervision Sit to Supine: 5: Supervision Details for Bed Mobility Assistance: cues for positioning prior to back into bed Transfers Sit to Stand: 4: Min guard;With upper extremity assist;From bed Stand to Sit: 4: Min guard;With upper extremity assist;To bed Details for Transfer Assistance: Cues for technique and hand placement. Cues not to pull up from RW and for positioning prior to sitting. Increased time to steady stand Ambulation/Gait Ambulation/Gait Assistance: 4: Min assist;3: Mod assist Ambulation Distance (Feet): 200 Feet Assistive device: Rolling walker;1 person hand held assist Ambulation/Gait Assistance Details: Patient continues to require Min A with RW for RW management, especially in tight spaces and turns. Mod A with HHA as patient with decreased step length and when challenged with distractions patient looses balance and requires Mod A to correct Gait Pattern: Step-through pattern;Decreased step length - right;Decreased step length - left;Ataxic;Narrow base of support Gait velocity: decr     Exercises     PT Diagnosis:    PT Problem List:   PT Treatment Interventions:     PT Goals Acute Rehab PT Goals PT Goal: Supine/Side to Sit - Progress: Progressing toward goal PT Goal: Sit to Supine/Side - Progress: Progressing toward goal PT Goal: Sit to Stand - Progress: Progressing toward goal PT Goal: Stand to Sit - Progress: Progressing toward goal PT Goal: Ambulate - Progress: Progressing toward goal  Visit Information  Last PT Received On: 10/26/12 Assistance Needed: +1    Subjective Data      Cognition  Cognition Arousal/Alertness: Awake/alert Behavior During Therapy: [facility] for tasks assessed/performed Area of Impairment: Problem solving;Safety/judgement;Attention Safety/Judgement: Decreased awareness of deficits    Balance  Static Sitting Balance Static Sitting - Balance Support: Feet supported;No upper extremity supported Static Sitting - Level of Assistance: 5: Stand by assistance Static Standing Balance Static Standing - Balance Support: Right upper extremity supported Static Standing - Level of Assistance: 5: Stand by assistance Static Standing - Comment/# of Minutes: Close guarding Dynamic Standing Balance Dynamic Standing - Balance Support: Right upper extremity supported;During functional activity Dynamic Standing - Level of Assistance: 4: Min assist  End of Session PT - End of Session Equipment Utilized During Treatment: Gait belt Activity Tolerance: Patient tolerated treatment well Patient left: in bed;with call bell/phone within reach;with bed alarm set   GP     Fredrich Birks 10/26/2012, 8:56 AM 10/26/2012 Fredrich Birks PTA [phone] pager [phone] office

## 2012-10-27 ENCOUNTER — Inpatient Hospital Stay (HOSPITAL_COMMUNITY): Payer: Medicare Other | Admitting: Physical Therapy

## 2012-10-27 ENCOUNTER — Inpatient Hospital Stay (HOSPITAL_COMMUNITY): Payer: Medicare Other | Admitting: Speech Pathology

## 2012-10-27 ENCOUNTER — Inpatient Hospital Stay (HOSPITAL_COMMUNITY): Payer: Medicare Other | Admitting: Occupational Therapy

## 2012-10-27 ENCOUNTER — Inpatient Hospital Stay (HOSPITAL_COMMUNITY): Payer: Medicare Other | Admitting: *Deleted

## 2012-10-27 DIAGNOSIS — F329 Major depressive disorder, single episode, unspecified: Secondary | ICD-10-CM

## 2012-10-27 DIAGNOSIS — I619 Nontraumatic intracerebral hemorrhage, unspecified: Secondary | ICD-10-CM

## 2012-10-27 LAB — COMPREHENSIVE METABOLIC PANEL
Albumin: 3 g/dL — ABNORMAL LOW (ref 3.5–5.2)
BUN: 18 mg/dL (ref 6–23)
Chloride: 97 mEq/L (ref 96–112)
Creatinine, Ser: 0.94 mg/dL (ref 0.50–1.10)
GFR calc Af Amer: 65 mL/min — ABNORMAL LOW (ref >90)
Total Bilirubin: 0.3 mg/dL (ref 0.3–1.2)

## 2012-10-27 LAB — CBC WITH DIFFERENTIAL/PLATELET
Basophils Relative: 0 % (ref 0–1)
Eosinophils Absolute: 0.3 10*3/uL (ref 0.0–0.7)
HCT: 32.9 % — ABNORMAL LOW (ref 36.0–46.0)
Hemoglobin: 10.3 g/dL — ABNORMAL LOW (ref 12.0–15.0)
MCH: 25.3 pg — ABNORMAL LOW (ref 26.0–34.0)
MCHC: 31.3 g/dL (ref 30.0–36.0)
Monocytes Absolute: 1 10*3/uL (ref 0.1–1.0)
Monocytes Relative: 14 % — ABNORMAL HIGH (ref 3–12)
Neutro Abs: 3.9 10*3/uL (ref 1.7–7.7)

## 2012-10-27 MED ORDER — ADULT MULTIVITAMIN W/MINERALS CH
1.0000 | ORAL_TABLET | Freq: Every day | ORAL | Status: DC
  Administered 2012-10-27 – 2012-11-04 (×9): 1 via ORAL
  Filled 2012-10-27 (×11): qty 1

## 2012-10-27 MED ORDER — ENSURE COMPLETE PO LIQD
120.0000 mL | Freq: Three times a day (TID) | ORAL | Status: DC
  Administered 2012-10-27 – 2012-11-02 (×14): 120 mL via ORAL
  Administered 2012-11-03: 11:00:00 via ORAL

## 2012-10-27 NOTE — Evaluation (Signed)
Speech Language Pathology Assessment and Plan  Patient Details  Name: Nicole Sparks MRN: [id number] Date of Birth: 1932-03-08  SLP Diagnosis: Cognitive Impairments  Rehab Potential: Good ELOS: 10-12 days   Today's Date: 10/27/2012 Time: 4098-1191 Time Calculation (min): 55 min  Problem List:  Patient Active Problem List   Diagnosis Date Noted  . Intraparenchymal hemorrhage of brain 10/27/2012  . Nonspecific (abnormal) findings on radiological and other examination of gastrointestinal tract 10/24/2012  . Reflux esophagitis 10/24/2012  . Weight loss, non-intentional 10/23/2012  . Duodenum disorder 10/23/2012  . Iron deficiency anemia 10/23/2012  . Traumatic cerebral intraparenchymal hematoma 10/22/2012  . Hyponatremia 10/22/2012  . Restless leg 10/22/2012  . Urethral stenosis 10/22/2012  . Recurrent UTI 10/22/2012  . Metabolic encephalopathy 10/22/2012  . Recurrent falls 10/22/2012   Past Medical History:  Past Medical History  Diagnosis Date  . COPD (chronic obstructive pulmonary disease)   . Asthma   . Arthritis   . Renal disorder   . Renal insufficiency    Past Surgical History:  Past Surgical History  Procedure Laterality Date  . Revision urostomy cutaneous    . Bladder surgery    . Abdominal hysterectomy      Assessment / Plan / Recommendation Clinical Impression  Patient is a 79 y.o. year old female right handed to female with history of COPD urethral stenosis with revision urostomy in the past. By report patient not doing well for the past 3 weeks with multiple falls and poor balance as well as reported 45 pound weight loss over the last 5 months. Presented 10/21/2012 with dizziness and more difficulty with gait. MRI of the brain shows late subacute intraparenchymal hematoma. Patient did not receive TPA. Neurology services consulted with full workup ongoing. CT of chest 10/22/2012 showed large hiatal hernia. COPD with questionable 6 mm right apex nodule and tiny  bilateral pleural effusions as well as old bilateral rib fractures. CT abdomen and pelvis with left hydronephrosis and hydroureter appears to be due to obstruction at the anastomosis with the urinary diversion question stricture. Colonic diverticulosis without evidence of diverticulitis. Duodenal thickening could potentially be an artifact from under distention but other etiologies included duodenitis inflammation or less likely tumor. CEA was pending. Underwent endoscopy 10/24/2012 was unremarkable. Contact precautions for positive MRSA nasal nares. Patient transferred to CIR on 10/26/2012 and presents with moderate cognitive impairments and demonstrating behaviors consistent with a Rancho Level VII and demonstrates impaired visual perceptual skills, decreased attention to left, decreased attention, decreased awareness, decreased problem solving, decreased safety awareness and decreased memory impacting pt's ability to safely perform ADL tasks. Patient will benefit from skilled SLP intervention to maximize cognitive recovery and overall functional independence. Anticipate patient will require 24 hour supervision and follow up home health therapy.     SLP Assessment  Patient will need skilled Speech Lanaguage Pathology Services during CIR admission    Recommendations  Recommendations for Other Services: Neuropsych consult Patient destination: Home Follow up Recommendations: Outpatient SLP Equipment Recommended: None recommended by SLP    SLP Frequency 5 out of 7 days   SLP Treatment/Interventions Cognitive remediation/compensation;Cueing hierarchy;Functional tasks;Environmental controls;Internal/external aids;Therapeutic Activities;Patient/family education    Pain Pain Assessment Pain Assessment: No/denies pain Pain Score: 0-No pain Prior Functioning    Short Term Goals: Week 1: SLP Short Term Goal 1 (Week 1): Pt will demonstrate functional problem solving for basic and familiar tasks with Min A  verbal and question cues.  SLP Short Term Goal 2 (Week 1): Pt will demonstrate  sustained attention to a functional task for 10 minutes with Min A verbal cues for redirection SLP Short Term Goal 3 (Week 1): Pt will utilize external memory aids to recall new, daily information with Min A visual and question cues.  SLP Short Term Goal 4 (Week 1): Pt will utilize call bell to express wants/needs with Mod I.  SLP Short Term Goal 5 (Week 1): Pt will demonstrate emergent awareness of deficits and verbalize 2 safety strategies with Min A question cues.   See FIM for current functional status Refer to Care Plan for Long Term Goals  Recommendations for other services: Neuropsych  Discharge Criteria: Patient will be discharged from SLP if patient refuses treatment 3 consecutive times without medical reason, if treatment goals not met, if there is a change in medical status, if patient makes no progress towards goals or if patient is discharged from hospital.  The above assessment, treatment plan, treatment alternatives and goals were discussed and mutually agreed upon: by patient  Nicole Sparks 10/27/2012, 4:59 PM

## 2012-10-27 NOTE — Care Management Note (Signed)
    Page 1 of 1   10/27/2012     8:19:50 AM   CARE MANAGEMENT NOTE 10/27/2012  Patient:  Nicole Sparks,Nicole Sparks   Account Number:  [id number]  Date Initiated:  10/22/2012  Documentation initiated by:  Donn Pierini  Subjective/Objective Assessment:   Pt admitted with weakness and MRI shows late subacute- hemorrhage     Action/Plan:   PTA pt had recently moved in with daughter- has having gait difficulty- PT/OT evals, CIR screen and consult   Anticipated DC Date:  10/26/2012   Anticipated DC Plan:  [facility]      DC Planning Services  CM consult      Choice offered to / List presented to:             Status of service:  Completed, signed off Medicare Important Message given?   (If response is "NO", the following Medicare IM given date fields will be blank) Date Medicare IM given:   Date Additional Medicare IM given:    Discharge Disposition:  [facility]  Per UR Regulation:  Reviewed for med. necessity/level of care/duration of stay  If discussed at Long Length of Stay Meetings, dates discussed:    Comments:  10/22/12- 1100- Donn Pierini RN BSN [phone] Spoke with pt at bedside- per conversation pt states that she lives with daughter - and has been having periods of dizziness and gait difficulty lately. She is open to rehab- per PT recommendations- CIR screen has been placed- will see about CIR consult- pt gave verbal permission to call daughter with updates- call placed to pt's daughter- Nicole Sparks- updated about pt working with PT this am and potential CIR- daughter states she feels like mom will need some sort of rehab prior to coming home CIR vs [facility]. NCM to continue to follow for d/c needs/planning

## 2012-10-27 NOTE — Progress Notes (Signed)
Nursing Note: Pt moving about in bed and bed alrm sounding often. Staff to the room frequently to check pt and turn off bed alrm repeatedly. Pt wanted to go to the bathroom and then changed her mind stating that her daughter did not want her to get up ,so she would not fall.Pt medicated for pain but  declined to reposition for comfort.Pt resting quietly in bed.wbb

## 2012-10-27 NOTE — Evaluation (Signed)
Occupational Therapy Assessment and Plan  Patient Details  Name: Taraji Mungo MRN: [id number] Date of Birth: 21-Feb-1932  OT Diagnosis: cognitive deficits and disturbance of vision Rehab Potential: Rehab Potential: Good ELOS: 7-10 days   Today's Date: 10/27/2012 Time: 0930-1030 Time Calculation (min): 60 min  Problem List:  Patient Active Problem List   Diagnosis Date Noted  . Intraparenchymal hemorrhage of brain 10/27/2012  . Nonspecific (abnormal) findings on radiological and other examination of gastrointestinal tract 10/24/2012  . Reflux esophagitis 10/24/2012  . Weight loss, non-intentional 10/23/2012  . Duodenum disorder 10/23/2012  . Iron deficiency anemia 10/23/2012  . Traumatic cerebral intraparenchymal hematoma 10/22/2012  . Hyponatremia 10/22/2012  . Restless leg 10/22/2012  . Urethral stenosis 10/22/2012  . Recurrent UTI 10/22/2012  . Metabolic encephalopathy 10/22/2012  . Recurrent falls 10/22/2012    Past Medical History:  Past Medical History  Diagnosis Date  . COPD (chronic obstructive pulmonary disease)   . Asthma   . Arthritis   . Renal disorder   . Renal insufficiency    Past Surgical History:  Past Surgical History  Procedure Laterality Date  . Revision urostomy cutaneous    . Bladder surgery    . Abdominal hysterectomy      Assessment & Plan Clinical Impression: Patient is a 79 y.o. year old female right handed to female with history of COPD urethral stenosis with revision urostomy in the past. By report patient not doing well for the past 3 weeks with multiple falls and poor balance as well as reported 45 pound weight loss over the last 5 months.. Presented 10/21/2012 with dizziness and more difficulty with gait. MRI of the brain shows late subacute intraparenchymal hematoma. Patient did not receive TPA. Neurology services consulted with full workup ongoing. CT of chest 10/22/2012 showed large hiatal hernia. COPD with questionable 6 mm right apex  nodule and tiny bilateral pleural effusions as well as old bilateral rib fractures. CT abdomen and pelvis with left hydronephrosis and hydroureter appears to be due to obstruction at the anastomosis with the urinary diversion question stricture. Colonic diverticulosis without evidence of diverticulitis. Duodenal thickening could potentially be an artifact from under distention but other etiologies included duodenitis inflammation or less likely tumor. CEA was pending. Underwent endoscopy 10/24/2012 was unremarkable. Contact precautions for positive MRSA nasal nares. Speech therapy evaluation completed 10/22/2012 noting mild to moderate cognitive deficits which are worsened from baseline. Patient required moderate verbal cues to commit information to working memory.  Patient transferred to CIR on 10/26/2012 .    Patient currently requires min with basic self-care skills and IADL secondary to decreased visual perceptual skills and decreased visual motor skills, decreased attention to left, decreased attention, decreased awareness, decreased problem solving, decreased safety awareness and decreased memory and decreased standing balance, decreased balance strategies and difficulty maintaining precautions.  Prior to hospitalization, patient could complete ADL with modified independent .  Patient will benefit from skilled intervention to decrease level of assist with basic self-care skills and increase independence with basic self-care skills prior to discharge home with care partner.  Anticipate patient will require 24 hour supervision and follow up home health.  OT - End of Session Activity Tolerance: Tolerates 30+ min activity with multiple rests Endurance Deficit: Yes OT Assessment Rehab Potential: Good Barriers to Discharge: Decreased caregiver support OT Plan OT Intensity: Minimum of 1-2 x/day, 45 to 90 minutes OT Frequency: 5 out of 7 days OT Duration/Estimated Length of Stay: 7-10 days OT  Treatment/Interventions: Balance/vestibular training;Community reintegration;Cognitive remediation/compensation;Discharge  planning;DME/adaptive equipment instruction;Patient/family education;Pain management;Psychosocial support;Self Care/advanced ADL retraining;Therapeutic Activities;UE/LE Strength taining/ROM;Visual/perceptual remediation/compensation OT Recommendation Patient destination: Home Follow Up Recommendations: Home health OT   Skilled Therapeutic Intervention   OT Evaluation Precautions/Restrictions  Precautions Precautions: Fall Precaution Comments: perceptual difficulties  Restrictions Weight Bearing Restrictions: No General Chart Reviewed: Yes Family/Caregiver Present: No Vital Signs   Pain Pain Assessment Pain Assessment: No/denies pain Home Living/Prior Functioning Home Living Lives With: Daughter Available Help at Discharge: Family Type of Home: House Home Access: Stairs to enter Secretary/administrator of Steps: 4-5 Entrance Stairs-Rails: Can reach both Home Layout: One level Bathroom Shower/Tub: Naval architect Equipment: None Prior Function Level of Independence: Independent with transfers;Independent with gait Able to Take Stairs?: Yes ADL   Vision/Perception  Vision - History Baseline Vision: No visual deficits Patient Visual Report: Overshooting Vision - Assessment Vision Assessment: Vision tested Ocular Range of Motion: Within Functional Limits Alignment/Gaze Preference: Gaze right Tracking/Visual Pursuits: Decreased smoothness of horizontal tracking;Decreased smoothness of vertical tracking;Requires cues, head turns, or add eye shifts to track Saccades: Additional eye shifts occurred during testing;Additional head turns occurred during testing;Undershoots;Decreased speed of saccadic movement Visual Fields: Left visual field deficit Perception Perception: Impaired Inattention/Neglect: Does not attend to left visual  field Spatial Orientation: impaired Praxis Praxis: Intact  Cognition Overall Cognitive Status: Impaired/Different from baseline Arousal/Alertness: Awake/alert Orientation Level: Oriented to person;Oriented to place Attention: Sustained Focused Attention: Appears intact Sustained Attention: Impaired Sustained Attention Impairment: Functional basic Selective Attention: Impaired Selective Attention Impairment: Functional basic Memory: Impaired Memory Impairment: Storage deficit;Retrieval deficit;Decreased short term memory Decreased Short Term Memory: Functional basic Awareness: Impaired Awareness Impairment: Emergent impairment Problem Solving: Impaired Problem Solving Impairment: Functional basic Executive Function: Organizing;Sequencing;Reasoning Reasoning: Impaired Reasoning Impairment: Functional basic Sequencing: Impaired Sequencing Impairment: Functional basic Organizing: Impaired Organizing Impairment: Functional basic Behaviors: Poor frustration tolerance Safety/Judgment: Impaired Rancho Mirant Scales of Cognitive Functioning: Automatic/appropriate Sensation Sensation Light Touch: Appears Intact Proprioception: Appears Intact Coordination Gross Motor Movements are Fluid and Coordinated: Yes Fine Motor Movements are Fluid and Coordinated: Yes Motor  Motor Motor: Within Functional Limits Mobility  Bed Mobility Bed Mobility: Supine to Sit;Sitting - Scoot to [place] of Bed;Sit to Supine Supine to Sit: 5: Supervision Sitting - Scoot to [place] of Bed: 5: Supervision Transfers Sit to Stand: 4: Min assist Sit to Stand Details (indicate cue type and reason): steadying assist for balance Stand to Sit: 4: Min assist  Trunk/Postural Assessment  Cervical Assessment Cervical Assessment: Within Functional Limits Thoracic Assessment Thoracic Assessment:  (kyphosis ) Lumbar Assessment Lumbar Assessment:  (posterior pelvic tilit) Postural Control Postural Control:  Deficits on evaluation Righting Reactions: delayed  Balance Standardized Balance Assessment Standardized Balance Assessment: Berg Balance Test Berg Balance Test Sit to Stand: Needs minimal aid to stand or to stabilize Standing Unsupported: Able to stand 2 minutes with supervision Sitting with Back Unsupported but Feet Supported on Floor or Stool: Able to sit safely and securely 2 minutes Stand to Sit: Controls descent by using hands Transfers: Needs one person to assist Standing Unsupported with Eyes Closed: Able to stand 10 seconds with supervision Standing Ubsupported with Feet Together: Needs help to attain position but able to stand for 30 seconds with feet together From Standing, Reach Forward with Outstretched Arm: Reaches forward but needs supervision From Standing Position, Pick up Object from Floor: Able to pick up shoe, needs supervision From Standing Position, Turn to Look Behind Over each Shoulder: Needs supervision when turning Turn 360 Degrees: Needs close supervision or verbal cueing  Standing Unsupported, Alternately Place Feet on Step/Stool: Needs assistance to keep from falling or unable to try Standing Unsupported, One Foot in Front: Needs help to step but can hold 15 seconds Standing on One Leg: Unable to try or needs assist to prevent fall Total Score: 23 Extremity/Trunk Assessment RUE Assessment RUE Assessment: Within Functional Limits LUE Assessment LUE Assessment: Within Functional Limits  FIM:  FIM - Grooming Grooming Steps: Wash, rinse, dry face;Wash, rinse, dry hands;Oral care, brush teeth, clean dentures;Brush, comb hair;Shave or apply make-up Grooming: 4: Patient completes 3 of 4 or 4 of 5 steps FIM - Bathing Bathing Steps Patient Completed: Chest;Right Arm;Left Arm;Abdomen;Front perineal area;Buttocks;Right upper leg;Left upper leg;Right lower leg (including foot);Left lower leg (including foot) Bathing: 4: Steadying assist FIM - Upper Body  Dressing/Undressing Upper body dressing/undressing steps patient completed: Thread/unthread right sleeve of pullover shirt/dresss;Thread/unthread left sleeve of pullover shirt/dress;Pull shirt over trunk;Put head through opening of pull over shirt/dress Upper body dressing/undressing: 4: Min-Patient completed 75 plus % of tasks FIM - Lower Body Dressing/Undressing Lower body dressing/undressing steps patient completed: Thread/unthread right underwear leg;Thread/unthread left underwear leg;Pull underwear up/down;Thread/unthread right pants leg;Thread/unthread left pants leg;Pull pants up/down;Don/Doff right sock;Don/Doff left sock;Don/Doff left shoe;Fasten/unfasten left shoe;Fasten/unfasten right shoe;Don/Doff right shoe Lower body dressing/undressing: 4: Steadying Assist FIM - Toileting Toileting steps completed by patient: Adjust clothing prior to toileting;Performs perineal hygiene;Adjust clothing after toileting Toileting: 4: Steadying assist FIM - Bed/Chair Transfer Bed/Chair Transfer: 4: Chair or W/C > Bed: Min A (steadying Pt. > 75%);4: Bed > Chair or W/C: Min A (steadying Pt. > 75%) FIM - Toilet Transfers Toilet Transfers: 4-From toilet/BSC: Min A (steadying Pt. > 75%);4-To toilet/BSC: Min A (steadying Pt. > 75%)   Refer to Care Plan for Long Term Goals  Recommendations for other services: Neuropsych  Discharge Criteria: Patient will be discharged from OT if patient refuses treatment 3 consecutive times without medical reason, if treatment goals not met, if there is a change in medical status, if patient makes no progress towards goals or if patient is discharged from hospital.  The above assessment, treatment plan, treatment alternatives and goals were discussed and mutually agreed upon: by patient  1:1 OT eval initiated with OT goals, purpose and role discussed. Self care retraining at sink level with focus on functional ambulation with RW, toileting, toilet transfer, standing balance,  left visual attention, simple problem solving, working and short term memory, sit to stands, balance reactions. Pt demonstrated left visual inattention and perceptual difficulties. Pt don pants and underwear backwards twice with poor frustration tolerance, decr awareness of deficits and decline any assistance.  Roney Mans [facility] 10/27/2012, 11:55 AM

## 2012-10-27 NOTE — Progress Notes (Signed)
INITIAL NUTRITION ASSESSMENT  DOCUMENTATION CODES Per approved criteria  -Non-severe (moderate) malnutrition in the context of chronic illness   INTERVENTION:  Ensure Complete with meds  MVI  RD to continue to follow nutrition care plan  NUTRITION DIAGNOSIS: Malnutrition related to inadequate intake as evidenced by 20% weight loss in one year and mild loss of muscle mass.   Goal: Intake to meet >90% of estimated nutrition needs.  Monitor:  PO intake, labs, weight trend.  Reason for Assessment: Health History  79 y.o. female  Admitting Dx: R parietal infarct with subacute intraparenchymal hematoma  ASSESSMENT: Patient was brought in due to multiple falls related to poor balance for the past 3 weeks. Was diagnosed with UTI 3 weeks ago and has had poor balance since then. CT of abdomen and pelvis with L hydronephrosis. Duodenal thickening could potentially be an artifact from under distention but other etiologies included duodenitis inflammation or less likely tumor. Underwent endoscopy 5/24 was unremarkable. Speech therapy evaluation completed 10/22/2012 noting mild to moderate cognitive deficits which are worsened from baseline.   Pt seen by RD during acute hospitalization. Pt reports that she has been eating well and activity has been normal, but has progressively lost weight over the past year. Usual weight ~148 lb, initial admit wt to hospital was 118 lb. She says that she continues to lose weight despite good intake. Drinks Boost at home, also likes Ensure, vanilla and strawberry flavors. Has been eating 50-75% of meals since hospital admission. Pt reports that her oral intake is good, denies any questions/concerns regarding nutrition at this time.  Nutrition Focused Physical Exam:  Subcutaneous Fat:  Orbital Region: WNL Upper Arm Region: mild-moderate depletion Thoracic and Lumbar Region: NA  Muscle:  Temple Region: mild-moderate depletion Clavicle Bone Region:  mild-moderate depletion Clavicle and Acromion Bone Region: mild-moderate depletion Scapular Bone Region: NA Dorsal Hand: mild-moderate depletion Patellar Region: WNL Anterior Thigh Region: WNL Posterior Calf Region: WNL  Edema: none  Pt meets criteria for non-severe (moderate) MALNUTRITION in the context of chronic illness as evidenced by 20% weight loss in the past year and mild loss of muscle mass.  Height: Ht Readings from Last 1 Encounters:  10/26/12 5\' 2"  (1.575 m)    Weight: Wt Readings from Last 1 Encounters:  10/26/12 125 lb (56.7 kg)    Ideal Body Weight: 50 kg  % Ideal Body Weight: 113%  Wt Readings from Last 10 Encounters:  10/26/12 125 lb (56.7 kg)  10/21/12 118 lb 6.2 oz (53.7 kg)  10/21/12 118 lb 6.2 oz (53.7 kg)   Usual Body Weight: 148 lb (1 year ago)  % Usual Body Weight: 84%  BMI:  Body mass index is 22.86 kg/(m^2). WNL  Estimated Nutritional Needs: Kcal: 1350-1550 Protein: 70-80 gm Fluid: 1.4-1.6 L  Skin: no problems noted  Diet Order: General  EDUCATION NEEDS: -No education needs addressed at this time   Intake/Output Summary (Last 24 hours) at 10/27/12 1036 Last data filed at 10/27/12 0800  Gross per 24 hour  Intake    360 ml  Output    720 ml  Net   -360 ml    Labs:   Recent Labs Lab 10/23/12 0536 10/24/12 0620 10/26/12 1644 10/27/12 0530  NA 133* 133*  --  132*  K 4.1 4.2  --  4.1  CL 99 98  --  97  CO2 27 24  --  24  BUN 12 17  --  18  CREATININE 0.92 0.99 1.02 0.94  CALCIUM 9.2 8.7  --  8.9  GLUCOSE 101* 99  --  95    CBG (last 3)  No results found for this basename: GLUCAP,  in the last 72 hours  Scheduled Meds: . citalopram  40 mg Oral Daily  . enoxaparin (LOVENOX) injection  30 mg Subcutaneous Q24H  . mupirocin ointment  1 application Nasal BID  . nitrofurantoin (macrocrystal-monohydrate)  100 mg Oral Daily  . pantoprazole  40 mg Oral Q1200  . senna-docusate  1 tablet Oral BID    Continuous Infusions:    Past Medical History  Diagnosis Date  . COPD (chronic obstructive pulmonary disease)   . Asthma   . Arthritis   . Renal disorder   . Renal insufficiency     Past Surgical History  Procedure Laterality Date  . Revision urostomy cutaneous    . Bladder surgery    . Abdominal hysterectomy      Jarold Motto MS, RD, LDN Pager: [phone] After-hours pager: [phone]

## 2012-10-27 NOTE — Progress Notes (Signed)
Subjective/Complaints: No major issues. Setting off bed alarm frequently.   Objective: Vital Signs: Blood pressure 129/77, pulse 64, temperature 98 F (36.7 C), temperature source Oral, resp. rate 18, height 5\' 2"  (1.575 m), weight 56.7 kg (125 lb), SpO2 95.00%. No results found.  Recent Labs  10/26/12 1644 10/27/12 0530  WBC 8.7 7.4  HGB 11.1* 10.3*  HCT 35.0* 32.9*  PLT 302 253    Recent Labs  10/26/12 1644 10/27/12 0530  NA  --  132*  K  --  4.1  CL  --  97  GLUCOSE  --  95  BUN  --  18  CREATININE 1.02 0.94  CALCIUM  --  8.9   CBG (last 3)  No results found for this basename: GLUCAP,  in the last 72 hours  Wt Readings from Last 3 Encounters:  10/26/12 56.7 kg (125 lb)  10/21/12 53.7 kg (118 lb 6.2 oz)  10/21/12 53.7 kg (118 lb 6.2 oz)    Physical Exam:  Constitutional: She is oriented to person, place, and time. She appears well-developed and well-nourished. No distress.  HENT:  Head: Normocephalic and atraumatic.  Right Ear: External ear normal.  Left Ear: External ear normal.  Eyes: Conjunctivae and EOM are normal. Pupils are equal, round, and reactive to light. Right eye exhibits no discharge. Left eye exhibits no discharge.  Neck: Normal range of motion. Neck supple. No JVD present. No tracheal deviation present. No thyromegaly present.  Cardiovascular: Normal rate and regular rhythm. Exam reveals no friction rub. No gallops  No murmur heard.  Pulmonary/Chest: Effort normal and breath sounds normal. No respiratory distress. She has no wheezes. She has no rales. She exhibits no tenderness.  Abdominal: Soft. Bowel sounds are normal. She exhibits no distension. There is no tenderness. There is no rebound.  Musculoskeletal: She exhibits no edema.  Lymphadenopathy:  She has no cervical adenopathy.  Neurological: She is alert and oriented to person, place, and time. Follows all simple commands Mild left sided weakness (4 to 4+). Left PN and decreased FMC on  the left side.---was showing some improvement today in comparison to my consult exam. Sensation grossly intact. No CN findings. Cognitively had basic insight and awareness.---occasionally needed some redirection regarding topic of conversation. CN unremarkable.  Skin: Skin is dry.     Assessment/Plan: 1. Functional deficits secondary to Right parietal IPH, small traumatic SDH (right para falcine area) which require 3+ hours per day of interdisciplinary therapy in a comprehensive inpatient rehab setting. Physiatrist is providing close team supervision and 24 hour management of active medical problems listed below. Physiatrist and rehab team continue to assess barriers to discharge/monitor patient progress toward functional and medical goals. FIM:                                 Medical Problem List and Plan:  1. 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 DVT Prophylaxis/Anticoagulation: Subcutaneous Lovenox 30 mg daily. Monitor platelet counts and any signs of bleeding  3. Pain Management: Ultram as needed. Monitor with increased mobility  4. Mood: Celexa 40 mg daily. Provide emotional support and positive reinforcement. SW to eval. Consider neuropsych eval as well.  5.. Neuropsych: This patient is capable of making decisions on her own behalf.  6. Progressive weight loss. CT abdomen chest to be reviewed. Workup ongoing rule out tumor. Endoscopy study unremarkable. CEA pending. Weight loss could be born  out of depression related to loss of spouse.  7. Positive MRSA nasal nares. Contact precautions  8. COPD. Checking oxygen saturations every shift. Patient quit smoking 24 years ago  9. Chronic suppression of UTI. Continue Macrobid. Check pvr's and emptying patterns   LOS (Days) 1 A FACE TO FACE EVALUATION WAS PERFORMED  Danielle Lento T 10/27/2012 8:22 AM

## 2012-10-27 NOTE — Progress Notes (Signed)
Patient information reviewed and entered into eRehab system by Shayla Heming, RN, CRRN, PPS Coordinator.  Information including medical coding and functional independence measure will be reviewed and updated through discharge.     Per nursing patient was given "Data Collection Information Summary for Patients in Inpatient Rehabilitation Facilities with attached "Privacy Act Statement-Health Care Records" upon admission.  

## 2012-10-27 NOTE — Progress Notes (Signed)
Physical Therapy Session Note  Patient Details  Name: Nicole Sparks MRN: [id number] Date of Birth: May 22, 1932  Today's Date: 10/27/2012 Time: 1500-1600 Time Calculation (min): 60 min  Skilled Therapeutic Interventions/Progress Updates:    Patient received supine in bed. Session focused on balance, mobility with dual tasking, and wayfinding/navigating/problem solving around unit. See details below. Patient able to navigate room>gym, gym>laundry room, laundry room>family room, family room>patient room using signs. Patient switched clothes from washing machine to dryer. Patient performed sit<>stand from cushioned sofa of low height. Patient returned to room and left sitting in recliner with seatbelt donned and all needs within reach.  Therapy Documentation Precautions:  Precautions Precautions: Fall Precaution Comments: perceptual difficulties  Restrictions Weight Bearing Restrictions: No Pain: Pain Assessment Pain Assessment: No/denies pain Pain Score: 0-No pain Locomotion : Ambulation Ambulation: Yes Ambulation/Gait Assistance: 3: Mod assist Ambulation Distance (Feet): 140 Feet Assistive device: 1 person hand held assist Ambulation/Gait Assistance Details: Tactile cues for posture;Verbal cues for gait pattern;Verbal cues for precautions/safety Ambulation/Gait Assistance Details: Patient instructed in gait training 140' x1 (R HHA), 100' x1 (no HHA), 34' x1 (no HHA) and demonstrates posterior lean, decreased step length, and decreased trunk rotation.  Gait Gait: Yes Gait Pattern: Step-through pattern;Decreased step length - right;Decreased step length - left;Narrow base of support;Decreased trunk rotation;Shuffle High Level Ambulation High Level Ambulation: Side stepping;Backwards walking Side Stepping: x25' in each direction, requires constant min assist, mod assist for LOB; demonstrates difficulty with proper sequencing and technique/motor planning Backwards Walking: x25' with  constant min assist, mod assist for LOB posteriorly  Balance: Dynamic Standing Balance Dynamic Standing - Balance Support: Left upper extremity supported Dynamic Standing - Level of Assistance: 3: Mod assist Dynamic Standing - Balance Activities: Compliant surfaces Dynamic Standing - Comments: x60"; Patient with increased anxiety with standing on compliant surface. Patient with increased difficulty with motor planning stepping off of foam surface.  See FIM for current functional status  Therapy/Group: Individual Therapy  Chipper Herb. Nicole Sparks, PT, DPT  10/27/2012, 4:18 PM

## 2012-10-27 NOTE — Progress Notes (Signed)
Physical Therapy Note  Patient Details  Name: Janissa Bertram MRN: [id number] Date of Birth: [birth year] Today's Date: 10/27/2012  Time: 1100-1130 30 minutes  1:1 No c/o pain.  Gait throughout unit with RW with close supervision-min A.  Cuing for safe use of RW and attention to task.  Kitchen mobility for thought organization, looking for items to make for a snack.  Pt requires mod cuing to locate items and for problem solving where in kitchen to find items to eat for a snack, limited by decreased attention and problem solving.  Pt with decreased frustration tolerance.  Overall min A for balance, mod A for cognition.   Adrick Kestler 10/27/2012, 11:59 AM

## 2012-10-27 NOTE — Evaluation (Signed)
Physical Therapy Assessment and Plan  Patient Details  Name: Nicole Sparks MRN: [id number] Date of Birth: 1931/09/21  PT Diagnosis: Abnormality of gait, Cognitive deficits and Difficulty walking Rehab Potential: Good ELOS: 7-10 days   Today's Date: 10/27/2012 Time:800-900Time Calculation (min): 60 min  Problem List:  Patient Active Problem List   Diagnosis Date Noted  . Intraparenchymal hemorrhage of brain 10/27/2012  . Nonspecific (abnormal) findings on radiological and other examination of gastrointestinal tract 10/24/2012  . Reflux esophagitis 10/24/2012  . Weight loss, non-intentional 10/23/2012  . Duodenum disorder 10/23/2012  . Iron deficiency anemia 10/23/2012  . Traumatic cerebral intraparenchymal hematoma 10/22/2012  . Hyponatremia 10/22/2012  . Restless leg 10/22/2012  . Urethral stenosis 10/22/2012  . Recurrent UTI 10/22/2012  . Metabolic encephalopathy 10/22/2012  . Recurrent falls 10/22/2012    Past Medical History:  Past Medical History  Diagnosis Date  . COPD (chronic obstructive pulmonary disease)   . Asthma   . Arthritis   . Renal disorder   . Renal insufficiency    Past Surgical History:  Past Surgical History  Procedure Laterality Date  . Revision urostomy cutaneous    . Bladder surgery    . Abdominal hysterectomy      Assessment & Plan Clinical Impression: Patient is a 79 y.o. year old female with recent admission to the hospital on 10/21/2012 with dizziness and more difficulty with gait. MRI of the brain shows late subacute intraparenchymal hematoma. Patient did not receive TPA. Neurology services consulted with full workup ongoing. CT of chest 10/22/2012 showed large hiatal hernia. COPD with questionable 6 mm right apex nodule and tiny bilateral pleural effusions as well as old bilateral rib fractures. CT abdomen and pelvis with left hydronephrosis and hydroureter appears to be due to obstruction at the anastomosis with the urinary diversion  question stricture. Colonic diverticulosis without evidence of diverticulitis. Duodenal thickening could potentially be an artifact from under distention but other etiologies included duodenitis inflammation or less likely tumor. CEA was pending. Underwent endoscopy 10/24/2012 was unremarkable. Contact precautions for positive MRSA nasal nares.  Patient transferred to CIR on 10/26/2012 .   Patient currently requires min with mobility secondary to decreased attention, decreased awareness, decreased problem solving and decreased safety awareness and decreased standing balance, decreased postural control and decreased balance strategies.  Prior to hospitalization, patient was independent  with mobility and lived with Daughter in a House home.  Home access is 4-5Stairs to enter.  Patient will benefit from skilled PT intervention to maximize safe functional mobility, minimize fall risk and decrease caregiver burden for planned discharge home with 24 hour supervision.  Anticipate patient will benefit from follow up OP at discharge.  PT - End of Session Activity Tolerance: Tolerates 30+ min activity with multiple rests PT Assessment Rehab Potential: Good PT Plan PT Intensity: Minimum of 1-2 x/day ,45 to 90 minutes PT Frequency: 5 out of 7 days PT Duration Estimated Length of Stay: 7-10 days PT Treatment/Interventions: Ambulation/gait training;Discharge planning;Functional mobility training;Therapeutic Activities;Therapeutic Exercise;Wheelchair propulsion/positioning;Neuromuscular re-education;Balance/vestibular training;Cognitive remediation/compensation;Pain management;DME/adaptive equipment instruction;Community reintegration;Splinting/orthotics;UE/LE Strength taining/ROM;UE/LE Risk manager training;Patient/family education PT Recommendation Recommendations for Other Services: Neuropsych consult Follow Up Recommendations: Outpatient PT Patient destination: Home  Skilled Therapeutic  Intervention Gait training with RW, pt able to gait with supervision to steady assist. Pt requires cuing for safety at all times with RW, needs cues to remember to use RW at all times, ? Safety with RW vs increased assist with HHA, will continue to assess appropriate AD.  Standing balance  training: Sharlene Motts balance test performed, pt scored 23/56, pt verbalizes that she understands she has an increased risk for falls.  Path finding with mod cues for attention to task, pt limited by decreased organization of thoughts, requiring frequent cuing to follow directions and stay on task.  PT Evaluation Precautions/Restrictions Precautions Precautions: Fall Precaution Comments: perceptual difficulties  Restrictions Weight Bearing Restrictions: No Pain Pain Assessment Pain Assessment: No/denies pain Home Living/Prior Functioning Home Living Lives With: Daughter Available Help at Discharge: Family Type of Home: House Home Access: Stairs to enter Secretary/administrator of Steps: 4-5 Entrance Stairs-Rails: Can reach both Home Layout: One level Bathroom Shower/Tub: Naval architect Equipment: None Prior Function Level of Independence: Independent with transfers;Independent with gait Able to Take Stairs?: Yes  Cognition Overall Cognitive Status: Impaired/Different from baseline Arousal/Alertness: Awake/alert Orientation Level: Oriented to person;Oriented to place Attention: Sustained Sustained Attention: Impaired Awareness: Impaired Awareness Impairment: Emergent impairment Behaviors: Poor frustration tolerance Safety/Judgment: Impaired Rancho Mirant Scales of Cognitive Functioning: Automatic/appropriate Sensation Sensation Light Touch: Appears Intact Proprioception: Appears Intact Coordination Gross Motor Movements are Fluid and Coordinated: Yes Motor  Motor Motor: Within Functional Limits  Mobility Bed Mobility Supine to Sit: 5: Supervision Transfers Sit to Stand:  4: Min assist Sit to Stand Details (indicate cue type and reason): steadying assist for balance Stand to Sit: 4: Min assist Locomotion  Ambulation Ambulation: Yes Ambulation/Gait Assistance: 3: Mod assist Ambulation Distance (Feet): 25 Feet Assistive device: 1 person hand held assist Ambulation/Gait Assistance Details: without AD pt tends to grab for furniture, requires mod A for balance reactions when distracted.  Pt with short step length, decreased arm swing Stairs / Additional Locomotion Stairs: Yes Stairs Assistance: 4: Min assist Stair Management Technique: Two rails Number of Stairs: 3  Trunk/Postural Assessment  Cervical Assessment Cervical Assessment: Within Functional Limits Thoracic Assessment Thoracic Assessment:  (kyphosis) Lumbar Assessment Lumbar Assessment:  (posterior pelvic tilt) Postural Control Postural Control: Deficits on evaluation Righting Reactions: delayed  Balance Standardized Balance Assessment Standardized Balance Assessment: Berg Balance Test Berg Balance Test Sit to Stand: Needs minimal aid to stand or to stabilize Standing Unsupported: Able to stand 2 minutes with supervision Sitting with Back Unsupported but Feet Supported on Floor or Stool: Able to sit safely and securely 2 minutes Stand to Sit: Controls descent by using hands Transfers: Needs one person to assist Standing Unsupported with Eyes Closed: Able to stand 10 seconds with supervision Standing Ubsupported with Feet Together: Needs help to attain position but able to stand for 30 seconds with feet together From Standing, Reach Forward with Outstretched Arm: Reaches forward but needs supervision From Standing Position, Pick up Object from Floor: Able to pick up shoe, needs supervision From Standing Position, Turn to Look Behind Over each Shoulder: Needs supervision when turning Turn 360 Degrees: Needs close supervision or verbal cueing Standing Unsupported, Alternately Place Feet on  Step/Stool: Needs assistance to keep from falling or unable to try Standing Unsupported, One Foot in Front: Needs help to step but can hold 15 seconds Standing on One Leg: Unable to try or needs assist to prevent fall Total Score: 23 Extremity Assessment      RLE Assessment RLE Assessment: Within Functional Limits LLE Assessment LLE Assessment: Within Functional Limits  FIM:  FIM - Bed/Chair Transfer Bed/Chair Transfer: 4: Chair or W/C > Bed: Min A (steadying Pt. > 75%);4: Bed > Chair or W/C: Min A (steadying Pt. > 75%) FIM - Locomotion: Ambulation Ambulation/Gait Assistance: 3: Mod assist Locomotion: Ambulation:  1: Travels less than 50 ft with moderate assistance (Pt: 50 - 74%) FIM - Locomotion: Stairs Locomotion: Stairs: 1: Up and Down < 4 stairs with minimal assistance (Pt.>75%)   Refer to Care Plan for Long Term Goals  Recommendations for other services: Neuropsych  Discharge Criteria: Patient will be discharged from PT if patient refuses treatment 3 consecutive times without medical reason, if treatment goals not met, if there is a change in medical status, if patient makes no progress towards goals or if patient is discharged from hospital.  The above assessment, treatment plan, treatment alternatives and goals were discussed and mutually agreed upon: by patient  [facility] 10/27/2012, 11:48 AM

## 2012-10-28 ENCOUNTER — Inpatient Hospital Stay (HOSPITAL_COMMUNITY): Payer: Medicare Other | Admitting: Physical Therapy

## 2012-10-28 ENCOUNTER — Inpatient Hospital Stay (HOSPITAL_COMMUNITY): Payer: Medicare Other

## 2012-10-28 ENCOUNTER — Encounter (HOSPITAL_COMMUNITY): Payer: Medicare Other | Admitting: Occupational Therapy

## 2012-10-28 ENCOUNTER — Inpatient Hospital Stay (HOSPITAL_COMMUNITY): Payer: Medicare Other | Admitting: Speech Pathology

## 2012-10-28 ENCOUNTER — Inpatient Hospital Stay (HOSPITAL_COMMUNITY): Payer: Medicare Other | Admitting: Occupational Therapy

## 2012-10-28 ENCOUNTER — Encounter (HOSPITAL_COMMUNITY): Payer: Self-pay | Admitting: Internal Medicine

## 2012-10-28 NOTE — ED Provider Notes (Signed)
Medical screening examination/treatment/procedure(s) were conducted as a shared visit with non-physician practitioner(s) and myself.  I personally evaluated the patient during the encounter  Nicole Sparks is a 79 y.o. female hx of COPD, asthma here with weakness and lighheadedness worsening for last 3 weeks. She has chronic UTI and has been on cipro and keflex. She also has headaches and falls. Nonfocal neuro exam. She has urostomy bag. CT showed possible bleed. MRI showed likely intraparenchymal hemorrhage. She also has UI and given ceftriaxone. I called Dr. Thad Ranger from neurology, who wants patient transferred to [facility] to stepdown for monitoring.   CRITICAL CARE Performed by: [name], [name]   Total critical care time: 45 min   Critical care time was exclusive of separately billable procedures and treating other patients.  Critical care was necessary to treat or prevent imminent or life-threatening deterioration.  Critical care was time spent personally by me on the following activities: development of treatment plan with patient and/or surrogate as well as nursing, discussions with consultants, evaluation of patient's response to treatment, examination of patient, obtaining history from patient or surrogate, ordering and performing treatments and interventions, ordering and review of laboratory studies, ordering and review of radiographic studies, pulse oximetry and re-evaluation of patient's condition.    [name], MD 10/28/12 [phone]

## 2012-10-28 NOTE — Progress Notes (Signed)
Subjective/Complaints: Slept well. Denies any problems this am A 12 point review of systems has been performed and if not noted above is otherwise negative.   Objective: Vital Signs: Blood pressure 130/70, pulse 62, temperature 98.7 F (37.1 C), temperature source Oral, resp. rate 17, height 5\' 2"  (1.575 m), weight 56.7 kg (125 lb), SpO2 94.00%. No results found.  Recent Labs  10/26/12 1644 10/27/12 0530  WBC 8.7 7.4  HGB 11.1* 10.3*  HCT 35.0* 32.9*  PLT 302 253    Recent Labs  10/26/12 1644 10/27/12 0530  NA  --  132*  K  --  4.1  CL  --  97  GLUCOSE  --  95  BUN  --  18  CREATININE 1.02 0.94  CALCIUM  --  8.9   CBG (last 3)  No results found for this basename: GLUCAP,  in the last 72 hours  Wt Readings from Last 3 Encounters:  10/26/12 56.7 kg (125 lb)  10/21/12 53.7 kg (118 lb 6.2 oz)  10/21/12 53.7 kg (118 lb 6.2 oz)    Physical Exam:  Constitutional: She is oriented to person, place, and time. She appears well-developed and well-nourished. No distress.  HENT:  Head: Normocephalic and atraumatic.  Right Ear: External ear normal.  Left Ear: External ear normal.  Eyes: Conjunctivae and EOM are normal. Pupils are equal, round, and reactive to light. Right eye exhibits no discharge. Left eye exhibits no discharge.  Neck: Normal range of motion. Neck supple. No JVD present. No tracheal deviation present. No thyromegaly present.  Cardiovascular: Normal rate and regular rhythm. Exam reveals no friction rub. No gallops  No murmur heard.  Pulmonary/Chest: Effort normal and breath sounds normal. No respiratory distress. She has no wheezes. She has no rales. She exhibits no tenderness.  Abdominal: Soft. Bowel sounds are normal. She exhibits no distension. There is no tenderness. There is no rebound.  Musculoskeletal: She exhibits no edema.  Lymphadenopathy:  She has no cervical adenopathy.  Neurological: She is alert and oriented to person, place, and time. Follows  all simple commands Mild left sided weakness (4 to 4+). Left PN and decreased FMC on the left side.---  Sensation grossly intact. No CN findings. Cognitively had basic insight and awareness.---occasionally needed some redirection regarding topic of conversation. CN unremarkable.  Skin: Skin is dry.     Assessment/Plan: 1. Functional deficits secondary to Right parietal IPH, small traumatic SDH (right para falcine area) which require 3+ hours per day of interdisciplinary therapy in a comprehensive inpatient rehab setting. Physiatrist is providing close team supervision and 24 hour management of active medical problems listed below. Physiatrist and rehab team continue to assess barriers to discharge/monitor patient progress toward functional and medical goals. FIM: FIM - Bathing Bathing Steps Patient Completed: Chest;Right Arm;Left Arm;Abdomen;Front perineal area;Buttocks;Right upper leg;Left upper leg;Right lower leg (including foot);Left lower leg (including foot) Bathing: 4: Steadying assist  FIM - Upper Body Dressing/Undressing Upper body dressing/undressing steps patient completed: Thread/unthread right sleeve of pullover shirt/dresss;Thread/unthread left sleeve of pullover shirt/dress;Pull shirt over trunk;Put head through opening of pull over shirt/dress Upper body dressing/undressing: 4: Min-Patient completed 75 plus % of tasks FIM - Lower Body Dressing/Undressing Lower body dressing/undressing steps patient completed: Thread/unthread right underwear leg;Thread/unthread left underwear leg;Pull underwear up/down;Thread/unthread right pants leg;Thread/unthread left pants leg;Pull pants up/down;Don/Doff right sock;Don/Doff left sock;Don/Doff left shoe;Fasten/unfasten left shoe;Fasten/unfasten right shoe;Don/Doff right shoe Lower body dressing/undressing: 4: Steadying Assist  FIM - Toileting Toileting steps completed by patient: Adjust clothing prior  to toileting;Performs perineal  hygiene;Adjust clothing after toileting Toileting: 4: Steadying assist  FIM - Toilet Transfers Toilet Transfers: 4-From toilet/BSC: Min A (steadying Pt. > 75%);4-To toilet/BSC: Min A (steadying Pt. > 75%)  FIM - Bed/Chair Transfer Bed/Chair Transfer: 4: Chair or W/C > Bed: Min A (steadying Pt. > 75%);4: Bed > Chair or W/C: Min A (steadying Pt. > 75%)  FIM - Locomotion: Wheelchair Locomotion: Wheelchair: 0: Activity did not occur FIM - Locomotion: Ambulation Ambulation/Gait Assistance: 3: Mod assist Locomotion: Ambulation: 2: Travels 50 - 149 ft with moderate assistance (Pt: 50 - 74%)  Comprehension Comprehension Mode: Auditory Comprehension: 2-Understands basic 25 - 49% of the time/requires cueing 51 - 75% of the time  Expression Expression Mode: Verbal Expression: 4-Expresses basic 75 - 89% of the time/requires cueing 10 - 24% of the time. Needs helper to occlude trach/needs to repeat words.  Social Interaction Social Interaction: 4-Interacts appropriately 75 - 89% of the time - Needs redirection for appropriate language or to initiate interaction.  Problem Solving Problem Solving: 2-Solves basic 25 - 49% of the time - needs direction more than half the time to initiate, plan or complete simple activities  Memory Memory: 2-Recognizes or recalls 25 - 49% of the time/requires cueing 51 - 75% of the time Medical Problem List and Plan:  1. 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 DVT Prophylaxis/Anticoagulation: Subcutaneous Lovenox 30 mg daily. Monitor platelet counts and any signs of bleeding  3. Pain Management: Ultram as needed. Monitor with increased mobility  4. Mood: Celexa 40 mg daily. Provide emotional support and positive reinforcement. SW to eval. Consider neuropsych eval as well.  5.. Neuropsych: This patient is capable of making decisions on her own behalf.   -baseline mild dementia likely 6. Progressive weight loss. CT  abdomen chest to be reviewed. Workup ongoing rule out tumor. Endoscopy study unremarkable. CEA pending. Weight loss could be born out of depression related to loss of spouse.  7. Positive MRSA nasal nares. Contact precautions  8. COPD. Checking oxygen saturations every shift. Patient quit smoking 24 years ago  9. Chronic suppression of UTI. Continue Macrobid. Checking  pvr's and emptying patterns   LOS (Days) 2 A FACE TO FACE EVALUATION WAS PERFORMED  Mariane Burpee T 10/28/2012 8:55 AM

## 2012-10-28 NOTE — Progress Notes (Signed)
Physical Therapy Note  Patient Details  Name: Nicole Sparks MRN: [id number] Date of Birth: [birth year] Today's Date: 10/28/2012  Time 1: 1130-1200 30 minutes  1:1 No c/o pain.  Treatment focused on balance retraining with static balance with UE reaching, bending, squatting with min-mod A.  Dynamic balance training with stepping activities with mod A.  Pt requires mod cuing for attention to task, problem solving and organization.  Time 2: 1415-1455 40 minutes  1:1 No c/o pain.  Standing balance/coordination/memory training with tapping numbers in sequence. Pt able to perform with min A for balance, supervision assist for memory up to 3 numbers, mod A for 4 number sequences.  Pt limited by decreased selective attention in busy environment.  Pt with decreased frustration tolerance this session, requiring frequent redirection.  Pt asks to work on "my shoulders"  Nu step performed for UE/LE strengthening x 5 minutes level 1.  Pt required mod cuing to keep L UE on handle.  Gait training with pt with noticeable gaze preference to R, cues to negotiate around obstacles on L.   Uri Turnbough 10/28/2012, 1:18 PM

## 2012-10-28 NOTE — Progress Notes (Signed)
Speech Language Pathology Daily Session Note  Patient Details  Name: Nicole Sparks MRN: [id number] Date of Birth: July 18, 1931  Today's Date: 10/28/2012 Time: 4098-1191 Time Calculation (min): 30 min  Short Term Goals: Week 1: SLP Short Term Goal 1 (Week 1): Pt will demonstrate functional problem solving for basic and familiar tasks with Min A verbal and question cues.  SLP Short Term Goal 2 (Week 1): Pt will demonstrate sustained attention to a functional task for 10 minutes with Min A verbal cues for redirection SLP Short Term Goal 3 (Week 1): Pt will utilize external memory aids to recall new, daily information with Min A visual and question cues.  SLP Short Term Goal 4 (Week 1): Pt will utilize call bell to express wants/needs with Mod I.  SLP Short Term Goal 5 (Week 1): Pt will demonstrate emergent awareness of deficits and verbalize 2 safety strategies with Min A question cues.   Skilled Therapeutic Interventions: Treatment focus on cognitive goals. Pt required Mod question and verbal cues to recall location of schedule to utilize to recall previous sessions and for anticipation of next sessions. Pt also participated in basic problem solving task with money management and required Max A verbal, visual and question cues for organization, awareness and problem solving throughout the task. Pt's overall decreased working memory and disorganization impact pt's ability to perform functional tasks.    FIM:  Comprehension Comprehension: 3-Understands basic 50 - 74% of the time/requires cueing 25 - 50%  of the time Expression Expression: 4-Expresses basic 75 - 89% of the time/requires cueing 10 - 24% of the time. Needs helper to occlude trach/needs to repeat words. Social Interaction Social Interaction: 4-Interacts appropriately 75 - 89% of the time - Needs redirection for appropriate language or to initiate interaction. Problem Solving Problem Solving: 2-Solves basic 25 - 49% of the time -  needs direction more than half the time to initiate, plan or complete simple activities Memory Memory: 2-Recognizes or recalls 25 - 49% of the time/requires cueing 51 - 75% of the time  Pain Pain Assessment Pain Assessment: No/denies pain  Therapy/Group: Individual Therapy  Nicole Sparks 10/28/2012, 4:26 PM

## 2012-10-28 NOTE — Progress Notes (Signed)
Occupational Therapy Note  Patient Details  Name: Nicole Sparks MRN: [id number] Date of Birth: 1932-05-19 Today's Date: 10/28/2012  Time: 1300-1341 Pt c/o back pain stating that "it's been bothering me for months." - unrated; RN aware and repositioned Individual Therapy  Pt amb with HHA to ADL apartment to prepare peanut butter sandwich.  Pt required min verbal cues to systematically search in cabinets to locate poeanut butter.  On two occasions pt selected items from cabinet, opened different cabinet door and placed the item back into the cabinet.  Upon completion of preparation patient washed knife appropriately and disposed of trash.  Pt instructed to lead the way back to room.  Pt remembered room number but required mod verbal cues for determining direction to use signs to assist with locating room.  Pt attempted to enter therapy gym (with numerous patients in gym) stating that it was her room.  When questioned why everyone was in her room patient stated that they were there for physical therapy.  I questioned why they would come to her room and she stated that was what they needed to do.  Persuaded patient that gym was not her room.  Pt required mod verbal cues to locate signage with room directions and patient was able to lead way to room.  Pt returned to recliner with quick release belt on.  Pt requested instructions on how to remove belt.  I explained to her that the belt was to remind her not to get up and I could not instruct her in the removal.   Rich Brave 10/28/2012, 1:42 PM

## 2012-10-28 NOTE — Progress Notes (Signed)
Occupational Therapy Session Note  Patient Details  Name: Nicole Sparks MRN: [id number] Date of Birth: 03/03/1932  Today's Date: 10/28/2012 Time: 0815-0900 Time Calculation (min): 45 min  Short Term Goals: Week 1:  OT Short Term Goal 1 (Week 1): STG=LTG  Skilled Therapeutic Interventions/Progress Updates:    1:1 self care retraining with focus on functional ambulation with steady A around room to obtain all items needed for ADL, working/ short term memory, sit to stand, standing balance, basic problem solving with errors made in bathing and dressing due to decr memory or perceptual deficits, left visual attention, etc. Pt demonstrated significant difficulties organizing her self with ADL tasks and decreased working memory (forgetting what she already got out to wear for the day when the outfit was still in her hand- requiring mod A throughout session to organize self to thoroughly get clean and dressed.  Therapy Documentation Precautions:  Precautions Precautions: Fall Precaution Comments: perceptual difficulties  Restrictions Weight Bearing Restrictions: No Pain:  no c/o pain   See FIM for current functional status  Therapy/Group: Individual Therapy  Roney Mans [facility] 10/28/2012, 8:58 AM

## 2012-10-28 NOTE — Progress Notes (Signed)
Occupational Therapy Session Note  Patient Details  Name: Nicole Sparks MRN: [id number] Date of Birth: 02/28/1932  Today's Date: 10/28/2012 Time: 1100-1130 Time Calculation (min): 30 min  Short Term Goals: Week 1:  OT Short Term Goal 1 (Week 1): STG=LTG  Skilled Therapeutic Interventions/Progress Updates:  Patient in recliner with QRB in place and looking at information in a binder and writing down a phone number.  Patient wanting to call her daughter at work and when attempt to redirect to participate in scheduled OT session patient stated, "It wiil only be a few seconds". Patient proceeded to attempt to call her daughter however due to significant difficulty with organization, problem solving, and awareness of her deficits and her errors patient began to get agitated and eventually said,"I don't know why this is so hard".  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  Patient also stated that she was tired and that might be why she had difficulty.  Patient declined to continue phone task and was reoriented to her therapy schedule.  Patient agreed to allow assistance to ambulate to the bathroom to empty her urostomy bag, wash hands and inventory the 3 drawers in the stand next to her bed.  Patient then sat on her bed and announced that she was tired.  PT then entered the room for her next therapy.  Therapy Documentation Precautions:  Precautions Precautions: Fall Precaution Comments: perceptual difficulties  Restrictions Weight Bearing Restrictions: No Pain: Denies pain See FIM for current functional status  Therapy/Group: Individual Therapy  Rayona Sardinha 10/28/2012, 12:11 PM

## 2012-10-28 NOTE — Progress Notes (Signed)
Physical Therapy Session Note  Patient Details  Name: Nicole Sparks MRN: [id number] Date of Birth: [birth year]  Today's Date: 10/28/2012 Time: 4098-1191 Time Calculation (min): 43 min   Skilled Therapeutic Interventions/Progress Updates:    This session focused on gait with and without RW indoor and outdoor surfaces min assit (min hand held assist without RW and min assist with RW).  We worked on endurance, obstacle navigation as well as Music therapist and path finding using signs to get outside and back to her room.  Stairs outside with one rail min assist with verbal cues for technique and sequencing.  Cues for safety throughout.  Definite signs of perceptual difficulties when trying to go through the door (or in her case the window).  She had difficulty problem solving this issue.    Therapy Documentation Precautions:  Precautions Precautions: Fall Precaution Comments: perceptual difficulties  Restrictions Weight Bearing Restrictions: No   Locomotion : Ambulation Ambulation/Gait Assistance: 4: Min assist   See FIM for current functional status  Therapy/Group: Individual Therapy  Lurena Joiner B. Sherrel Shafer, PT, DPT [phone]   10/28/2012, 12:28 PM

## 2012-10-29 ENCOUNTER — Inpatient Hospital Stay (HOSPITAL_COMMUNITY): Payer: Medicare Other

## 2012-10-29 ENCOUNTER — Inpatient Hospital Stay (HOSPITAL_COMMUNITY): Payer: Medicare Other | Admitting: Physical Therapy

## 2012-10-29 ENCOUNTER — Encounter (HOSPITAL_COMMUNITY): Payer: Medicare Other | Admitting: Occupational Therapy

## 2012-10-29 ENCOUNTER — Inpatient Hospital Stay (HOSPITAL_COMMUNITY): Payer: Medicare Other | Admitting: Speech Pathology

## 2012-10-29 ENCOUNTER — Encounter (HOSPITAL_COMMUNITY): Payer: Medicare Other

## 2012-10-29 NOTE — Progress Notes (Signed)
Subjective/Complaints: No issues. "tired" from therapy yesterday A 12 point review of systems has been performed and if not noted above is otherwise negative.   Objective: Vital Signs: Blood pressure 139/66, pulse 61, temperature 97.8 F (36.6 C), temperature source Oral, resp. rate 17, height 5\' 2"  (1.575 m), weight 57.1 kg (125 lb 14.1 oz), SpO2 97.00%. No results found.  Recent Labs  10/26/12 1644 10/27/12 0530  WBC 8.7 7.4  HGB 11.1* 10.3*  HCT 35.0* 32.9*  PLT 302 253    Recent Labs  10/26/12 1644 10/27/12 0530  NA  --  132*  K  --  4.1  CL  --  97  GLUCOSE  --  95  BUN  --  18  CREATININE 1.02 0.94  CALCIUM  --  8.9   CBG (last 3)  No results found for this basename: GLUCAP,  in the last 72 hours  Wt Readings from Last 3 Encounters:  10/28/12 57.1 kg (125 lb 14.1 oz)  10/21/12 53.7 kg (118 lb 6.2 oz)  10/21/12 53.7 kg (118 lb 6.2 oz)    Physical Exam:  Constitutional: She is oriented to person, place, and time. She appears well-developed and well-nourished. No distress.  HENT:  Head: Normocephalic and atraumatic.  Right Ear: External ear normal.  Left Ear: External ear normal.  Eyes: Conjunctivae and EOM are normal. Pupils are equal, round, and reactive to light. Right eye exhibits no discharge. Left eye exhibits no discharge.  Neck: Normal range of motion. Neck supple. No JVD present. No tracheal deviation present. No thyromegaly present.  Cardiovascular: Normal rate and regular rhythm. Exam reveals no friction rub. No gallops  No murmur heard.  Pulmonary/Chest: Effort normal and breath sounds normal. No respiratory distress. She has no wheezes. She has no rales. She exhibits no tenderness.  Abdominal: Soft. Bowel sounds are normal. She exhibits no distension. There is no tenderness. There is no rebound.  Musculoskeletal: She exhibits no edema.  Lymphadenopathy:  She has no cervical adenopathy.  Neurological: She is alert and oriented to person, place,  and time. Follows all simple commands Mild left sided weakness (4 to 4+). Left PN and decreased FMC on the left side.---  Sensation grossly intact. No CN findings. Cognitively had basic insight and awareness.---occasionally needed some redirection regarding topic of conversation. CN unremarkable.  Skin: Skin is dry.     Assessment/Plan: 1. Functional deficits secondary to Right parietal IPH, small traumatic SDH (right para falcine area) which require 3+ hours per day of interdisciplinary therapy in a comprehensive inpatient rehab setting. Physiatrist is providing close team supervision and 24 hour management of active medical problems listed below. Physiatrist and rehab team continue to assess barriers to discharge/monitor patient progress toward functional and medical goals. FIM: FIM - Bathing Bathing Steps Patient Completed: Chest;Right Arm;Left Arm;Abdomen;Front perineal area;Buttocks;Right upper leg;Left upper leg;Right lower leg (including foot);Left lower leg (including foot) Bathing: 4: Steadying assist  FIM - Upper Body Dressing/Undressing Upper body dressing/undressing steps patient completed: Thread/unthread right sleeve of pullover shirt/dresss;Thread/unthread left sleeve of pullover shirt/dress;Pull shirt over trunk;Put head through opening of pull over shirt/dress Upper body dressing/undressing: 4: Min-Patient completed 75 plus % of tasks FIM - Lower Body Dressing/Undressing Lower body dressing/undressing steps patient completed: Thread/unthread right underwear leg;Thread/unthread left underwear leg;Pull underwear up/down;Thread/unthread right pants leg;Thread/unthread left pants leg;Pull pants up/down;Don/Doff right sock;Don/Doff left sock;Don/Doff left shoe;Fasten/unfasten left shoe;Fasten/unfasten right shoe;Don/Doff right shoe Lower body dressing/undressing: 4: Steadying Assist  FIM - Toileting Toileting steps completed by patient:  Adjust clothing prior to toileting;Performs  perineal hygiene;Adjust clothing after toileting Toileting: 4: Steadying assist  FIM - Toilet Transfers Toilet Transfers: 4-From toilet/BSC: Min A (steadying Pt. > 75%);4-To toilet/BSC: Min A (steadying Pt. > 75%)  FIM - Bed/Chair Transfer Bed/Chair Transfer: 5: Chair or W/C > Bed: Supervision (verbal cues/safety issues);5: Bed > Chair or W/C: Supervision (verbal cues/safety issues)  FIM - Locomotion: Wheelchair Locomotion: Wheelchair: 0: Activity did not occur FIM - Locomotion: Ambulation Locomotion: Ambulation Assistive Devices: Designer, industrial/product Ambulation/Gait Assistance: 4: Min assist Locomotion: Ambulation: 5: Travels 150 ft or more with supervision/safety issues  Comprehension Comprehension Mode: Auditory Comprehension: 2-Understands basic 25 - 49% of the time/requires cueing 51 - 75% of the time  Expression Expression Mode: Verbal Expression: 4-Expresses basic 75 - 89% of the time/requires cueing 10 - 24% of the time. Needs helper to occlude trach/needs to repeat words.  Social Interaction Social Interaction: 4-Interacts appropriately 75 - 89% of the time - Needs redirection for appropriate language or to initiate interaction.  Problem Solving Problem Solving: 2-Solves basic 25 - 49% of the time - needs direction more than half the time to initiate, plan or complete simple activities  Memory Memory: 2-Recognizes or recalls 25 - 49% of the time/requires cueing 51 - 75% of the time Medical Problem List and Plan:  1. 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 DVT Prophylaxis/Anticoagulation: Subcutaneous Lovenox 30 mg daily. Monitor platelet counts and any signs of bleeding  3. Pain Management: Ultram as needed. Monitor with increased mobility  4. Mood: Celexa 40 mg daily. Provide emotional support and positive reinforcement. SW to eval. Consider neuropsych eval as well.  5.. Neuropsych: This patient is capable of making decisions  on her own behalf.   -baseline mild dementia likely 6. Progressive weight loss. CT abdomen chest to be reviewed. Workup ongoing rule out tumor. Endoscopy study unremarkable. CEA pending. Weight loss could be born out of depression related to loss of spouse.  7. Positive MRSA nasal nares. Contact precautions  8. COPD. Checking oxygen saturations every shift. Patient quit smoking 24 years ago  9. Chronic suppression of UTI. Continue Macrobid.  No incontinence at present.   LOS (Days) 3 A FACE TO FACE EVALUATION WAS PERFORMED  Mirha Brucato T 10/29/2012 8:59 AM

## 2012-10-29 NOTE — Progress Notes (Signed)
Social Work Assessment and Plan Social Work Assessment and Plan  Patient Details  Name: Nicole Sparks MRN: [id number] Date of Birth: [birth year]  Today's Date: 10/29/2012  Problem List:  Patient Active Problem List   Diagnosis Date Noted  . Intraparenchymal hemorrhage of brain 10/27/2012  . Nonspecific (abnormal) findings on radiological and other examination of gastrointestinal tract 10/24/2012  . Reflux esophagitis 10/24/2012  . Weight loss, non-intentional 10/23/2012  . Duodenum disorder 10/23/2012  . Iron deficiency anemia 10/23/2012  . Traumatic cerebral intraparenchymal hematoma 10/22/2012  . Hyponatremia 10/22/2012  . Restless leg 10/22/2012  . Urethral stenosis 10/22/2012  . Recurrent UTI 10/22/2012  . Metabolic encephalopathy 10/22/2012  . Recurrent falls 10/22/2012   Past Medical History:  Past Medical History  Diagnosis Date  . COPD (chronic obstructive pulmonary disease)   . Asthma   . Arthritis   . Renal disorder   . Renal insufficiency    Past Surgical History:  Past Surgical History  Procedure Laterality Date  . Revision urostomy cutaneous    . Bladder surgery    . Abdominal hysterectomy    . Esophagogastroduodenoscopy N/A 10/24/2012    Procedure: ESOPHAGOGASTRODUODENOSCOPY (EGD);  Surgeon: Hart Carwin, MD;  Location: [facility] ENDOSCOPY;  Service: Endoscopy;  Laterality: N/A;   Social History:  reports that she quit smoking about 24 years ago. She has never used smokeless tobacco. She reports that  drinks alcohol. She reports that she does not use illicit drugs.  Family / Support Systems Marital Status: Widow/Widower How Long?: 2007 Patient Roles: Parent Children: daugher, Nicole Sparks @ [phone] or [phone] or [phone] Anticipated Caregiver: Nicole Sparks and Nicole Sparks Ability/Limitations of Caregiver: Nicole Sparks is a Charity fundraiser CM at [employer] works days: Nicole Sparks works at [employer]: 24/7 Family Dynamics: daughter very supportive and  involved - moved pt to her home a couple of months ago out of concern for her ability to manage at home alone any more  Social History Preferred language: English Religion:  Cultural Background: NA Read: Yes Write: Yes Employment Status: Retired Fish farm manager Issues: none Guardian/Conservator: none   Abuse/Neglect Physical Abuse: Denies Verbal Abuse: Denies Sexual Abuse: Denies Exploitation of patient/patient's resources: Denies Self-Neglect: Denies  Emotional Status Pt's affect, behavior adn adjustment status: Pt pleasant and talkative, however, coversation tends to ramble at times.  Pt able to provide basic personal information, however, fidgets in seat throughout most of interview.  Denies any s/s of depression or anxiety.  Will monitor and likely refer to neuropsych for cognitive testing. Recent Psychosocial Issues: move away from her own home into daughter's home in [place] approx 2 mo ago. Pyschiatric History: None Substance Abuse History: None  Patient / Family Perceptions, Expectations & Goals Pt/Family understanding of illness & functional limitations: pt able to report she had "blood collected" on her brain and aware she is on rehab unit.  Daughter with good understanding of pt's medical issues. Premorbid pt/family roles/activities: Pt physically indpendent PTA, however, with several falls.  Daughter reports, "Her thought process hasn't been great for awhile".   Anticipated changes in roles/activities/participation: Pt will likely require an absolute increase in supervision to 24/7 with daughter and friend becoming full-time caregivers. Pt/family expectations/goals: Daughter hopeful pt's cognition will continue to improve and that her gait stability will improve with CIR.  Community Resources [employer]: None Premorbid Home Care/DME Agencies: None Transportation available at discharge: yes Resource referrals recommended:  Neuropsychology  Discharge Planning Living Arrangements: Children Support Systems: Children Type of Residence: Private  residence Community education officer Resources: Electrical engineer Resources: Restaurant manager, fast food Screen Referred: No Living Expenses: Lives with family Money Management: Family Do you have any problems obtaining your medications?: No Home Management: shared  Patient/Family Preliminary Plans: Pt plans to return home with her daughter and friend to provide all needed assistance. Daughter in process of securing this. Social Work Anticipated Follow Up Needs: HH/OP Expected length of stay: ELOS 7 to 10 days  Clinical Impression Elderly woman here after ICH having h/o several falls at home.  Living with daughter (who is CM at [facility]) for approx 2 mos.  Plans to return home with daughter and other supports providing 24/7 supervision.    Nicole Sparks 10/29/2012, 8:20 AM

## 2012-10-29 NOTE — Progress Notes (Signed)
Physical Therapy Note  Patient Details  Name: Nicole Sparks MRN: [id number] Date of Birth: 17-Aug-1931 Today's Date: 10/29/2012  Time 1: 800-900 60 minutes  1:1 No c/o pain.  Household mobility and gait training with functional tasks of toileting, making bed, straightening up room with pt requiring supervision for balance, cuing for attention to task and organization.  Pt with noted perceptual deficits requiring cues for sitting safely on toilet, bed.  Direction following task with pt to follow 4 written directions for physical tasks, pt required only min cuing to follow directions correctly and min cues for organizing tasks.  Pt supervision for balance throughout session, min-mod cuing for path finding due to decreased attention and organization.  Time 2: 1300-1355 55 minutes  1:1 No c/o pain.  Treatment session focused on gait training in community environment and outdoors on a variety of surfaces.  Pt requires min steadying assist in busy environment and mod cuing to follow signs to specific locations and for using elevator correctly.  Pt requires heavy min A for gait on incline/declines and min A for gait on uneven surfaces and for stair negotiation outdoors.  Pt fatigues more easily on unlevel surfaces but is aware and is able to state when she needs rest breaks.  Pt requires min cuing for safety awareness in outdoor environment.  Pt reports "I think I need a cane".  PT agreed to attempt gait training with a SPC.  Pt continues to require steadying assist when gait is challenged dynamically.  Pt forgetful of cane and frequently lifts it off the floor while walking.  PT attempted to explain that gait with a cane is not safe at this time, pt with difficulty understanding.    Feliberto Stockley 10/29/2012, 8:58 AM

## 2012-10-29 NOTE — Progress Notes (Signed)
Occupational Therapy Session Note  Patient Details  Name: Quincie Haroon MRN: [id number] Date of Birth: 30-May-1932  Today's Date: 10/29/2012 Time: 1000-1100 Time Calculation (min): 60 min  Short Term Goals: Week 1:  OT Short Term Goal 1 (Week 1): STG=LTG  Skilled Therapeutic Interventions/Progress Updates:    1:1 self care retraining at shower level with focus on functional ambulation, task organization, standing balances, transfers, simple problem solving, sequencing through morning routine, working memory. Pt performed shower transfer with steady A. Pt required extra time for undressing and dressing but able to correct with extra time and subtle cues. Pt able to change her urostomy bag (RN present), don makeup and brush teeth in standing at the sink with supervision to occasional steady A.  Therapy Documentation Precautions:  Precautions Precautions: Fall Precaution Comments: perceptual difficulties  Restrictions Weight Bearing Restrictions: No Pain: No c/opain See FIM for current functional status  Therapy/Group: Individual Therapy  Roney Mans [facility] 10/29/2012, 10:44 AM

## 2012-10-29 NOTE — Progress Notes (Signed)
Speech Language Pathology Daily Session Note  Patient Details  Name: Nicole Sparks MRN: [id number] Date of Birth: 1931/10/01  Today's Date: 10/29/2012 Time: 0920-1000 Time Calculation (min): 40 min  Short Term Goals: Week 1: SLP Short Term Goal 1 (Week 1): Pt will demonstrate functional problem solving for basic and familiar tasks with Min A verbal and question cues.  SLP Short Term Goal 2 (Week 1): Pt will demonstrate sustained attention to a functional task for 10 minutes with Min A verbal cues for redirection SLP Short Term Goal 3 (Week 1): Pt will utilize external memory aids to recall new, daily information with Min A visual and question cues.  SLP Short Term Goal 4 (Week 1): Pt will utilize call bell to express wants/needs with Mod I.  SLP Short Term Goal 5 (Week 1): Pt will demonstrate emergent awareness of deficits and verbalize 2 safety strategies with Min A question cues.   Skilled Therapeutic Interventions: Treatment focus on cognitive goals. SLP facilitated session by providing Mod A verbal, visual and question cues for 4 step sequencing and problem solving with picture cards.  Pt demonstrated increased organization of information with Mod A verbal and visual cues.    FIM:  Comprehension Comprehension Mode: Auditory Comprehension: 3-Understands basic 50 - 74% of the time/requires cueing 25 - 50%  of the time Expression Expression Mode: Verbal Expression: 4-Expresses basic 75 - 89% of the time/requires cueing 10 - 24% of the time. Needs helper to occlude trach/needs to repeat words. Social Interaction Social Interaction: 4-Interacts appropriately 75 - 89% of the time - Needs redirection for appropriate language or to initiate interaction. Problem Solving Problem Solving: 3-Solves basic 50 - 74% of the time/requires cueing 25 - 49% of the time Memory Memory: 2-Recognizes or recalls 25 - 49% of the time/requires cueing 51 - 75% of the time  Pain Pain Assessment Pain  Assessment: No/denies pain   Therapy/Group: Individual Therapy  Daya Dutt 10/29/2012, 12:16 PM

## 2012-10-29 NOTE — Progress Notes (Signed)
Physical Therapy Session Note  Patient Details  Name: Nicole Sparks MRN: [id number] Date of Birth: July 30, 1931  Today's Date: 10/29/2012 Time: 1415-1500 Time Calculation (min): 45 min  Skilled Therapeutic Interventions/Progress Updates:   Session focused on functional ambulation on unit with pathfinding, dynamic balance while completing functional laundry task getting clothing out of dryer, folding and putting away clothes, ball toss for cognitive challenge and dynamic balance and scavenger hunt in gym for obstacle negotiation. Pt overall S to steady A for balance and min to mod cues needed for memory, path finding, and organization.  Therapy Documentation Precautions:  Precautions Precautions: Fall Precaution Comments: perceptual difficulties  Restrictions Weight Bearing Restrictions: No  Pain: No complaints  See FIM for current functional status  Therapy/Group: Individual Therapy  Karolee Stamps [facility] 10/29/2012, 3:12 PM

## 2012-10-30 ENCOUNTER — Encounter (HOSPITAL_COMMUNITY): Payer: Medicare Other | Admitting: Occupational Therapy

## 2012-10-30 ENCOUNTER — Inpatient Hospital Stay (HOSPITAL_COMMUNITY): Payer: Medicare Other | Admitting: Physical Therapy

## 2012-10-30 ENCOUNTER — Inpatient Hospital Stay (HOSPITAL_COMMUNITY): Payer: Medicare Other | Admitting: Occupational Therapy

## 2012-10-30 ENCOUNTER — Inpatient Hospital Stay (HOSPITAL_COMMUNITY): Payer: Medicare Other | Admitting: Speech Pathology

## 2012-10-30 DIAGNOSIS — I619 Nontraumatic intracerebral hemorrhage, unspecified: Secondary | ICD-10-CM

## 2012-10-30 DIAGNOSIS — F329 Major depressive disorder, single episode, unspecified: Secondary | ICD-10-CM

## 2012-10-30 NOTE — Patient Care Conference (Signed)
Inpatient RehabilitationTeam Conference and Plan of Care Update Date: 10/27/2012   Time: 3:00 PM    Patient Name: Nicole Sparks      Medical Record Number: [id number]  Date of Birth: [birth year] Sex: Female         Room/Bed: 4029/4029-01 Payor Info: Payor: MEDICARE / Plan: MEDICARE PART A AND B / Product Type: *No Product type* /    Admitting Diagnosis: sick  Admit Date/Time:  10/26/2012  2:58 PM Admission Comments: No comment available   Primary Diagnosis:  Intraparenchymal hemorrhage of brain Principal Problem: Intraparenchymal hemorrhage of brain  Patient Active Problem List   Diagnosis Date Noted  . Intraparenchymal hemorrhage of brain 10/27/2012  . Nonspecific (abnormal) findings on radiological and other examination of gastrointestinal tract 10/24/2012  . Reflux esophagitis 10/24/2012  . Weight loss, non-intentional 10/23/2012  . Duodenum disorder 10/23/2012  . Iron deficiency anemia 10/23/2012  . Traumatic cerebral intraparenchymal hematoma 10/22/2012  . Hyponatremia 10/22/2012  . Restless leg 10/22/2012  . Urethral stenosis 10/22/2012  . Recurrent UTI 10/22/2012  . Metabolic encephalopathy 10/22/2012  . Recurrent falls 10/22/2012    Expected Discharge Date: Expected Discharge Date: 11/04/12  Team Members Present: Social Worker Present: Amada Jupiter, LCSW Nurse Present: Other (comment) Keturah Barre, RN) PT Present: Reggy Eye, PT OT Present: Mackie Pai, OT;Jennifer Katrinka Blazing, OT SLP Present: Feliberto Gottron, SLP Other (Discipline and Name): Tora Duck, PPS Coordinator     Current Status/Progress Goal Weekly Team Focus  Medical   right parietal IPH, SDH fall  increase safety, visual perceptual abilities to make safer at home  cognitive behavioral rx   Bowel/Bladder   Pt has urostomy; continentt of bowel  Remain continent  Continue to monitor   Swallow/Nutrition/ Hydration             ADL's   min A overall  supervision overall  balance, balance reactions,  perceptual awarness, left inattention   Mobility   min A  supervision  attention, balance   Communication             Safety/Cognition/ Behavioral Observations  Mod A  Supervision  attention, problem solving, working memory with compensatory strategies, awareness   Pain   Pt c/o back pain controlled with current dose of tramadol  Pt will rate pain less than3 on scale of 0-10  Continue to assess Qshift and prn   Skin   Skin intact  Skin will remain intact  Continue to monitor    Rehab Goals Patient on target to meet rehab goals: Yes *See Care Plan and progress notes for long and short-term goals.  Barriers to Discharge: safety, awareness, balance    Possible Resolutions to Barriers:  supervision at home    Discharge Planning/Teaching Needs:  home with daughter to provide 24/7 assistance/ supervision      Team Discussion:  Anticipate easily reaching supervision goals, however, will need close supervision and cueing.  Recommend neuropsych eval on cognition.    Revisions to Treatment Plan:  None   Continued Need for Acute Rehabilitation Level of Care: The patient requires daily medical management by a physician with specialized training in physical medicine and rehabilitation for the following conditions: Daily direction of a multidisciplinary physical rehabilitation program to ensure safe treatment while eliciting the highest outcome that is of practical value to the patient.: Yes Daily medical management of patient stability for increased activity during participation in an intensive rehabilitation regime.: Yes Daily analysis of laboratory values and/or radiology reports with any subsequent need for  medication adjustment of medical intervention for : Other;Neurological problems  Alany Borman 10/30/2012, 2:04 PM

## 2012-10-30 NOTE — Progress Notes (Signed)
Physical Therapy Note  Patient Details  Name: Nicole Sparks MRN: [id number] Date of Birth: Mar 04, 1932 Today's Date: 10/30/2012  Time: 1400-1500 60 minutes  1:1 No c/o pain.  Pt ambulated to gift shop with close supervision without AD, min-mod cuing for path finding and using signs to find gift shop.  Pt given task of locating a birthday card, requires min cuing to recall why we were there and for L attention during obstacle negotiation in gift shop.  Pt able to find card with min cuing.  Gait training outdoors with min HHA for uneven surfaces, incline/decline and stair negotiation outside.  Pt with decreased awareness of impaired balance when outside.  [place] HEP initiated for LE strengthening and balance reactions.  Pt able to perform with visual and tactile cues, requires frequent rests due to fatigue from previous activity.   Elih Mooney 10/30/2012, 3:56 PM

## 2012-10-30 NOTE — Progress Notes (Signed)
Inpatient Rehabilitation Center Individual Statement of Services  Patient Name:  Nicole Sparks  Date:  10/30/2012  Welcome to the Inpatient Rehabilitation Center.  Our goal is to provide you with an individualized program based on your diagnosis and situation, designed to meet your specific needs.  With this comprehensive rehabilitation program, you will be expected to participate in at least 3 hours of rehabilitation therapies Monday-Friday, with modified therapy programming on the weekends.  Your rehabilitation program will include the following services:  Physical Therapy (PT), Occupational Therapy (OT), Speech Therapy (ST), 24 hour per day rehabilitation nursing, Therapeutic Recreaction (TR), Neuropsychology, Case Management (Social Worker), Rehabilitation Medicine, Nutrition Services and Pharmacy Services  Weekly team conferences will be held on Tuesdays to discuss your progress.  Your Social Worker will talk with you frequently to get your input and to update you on team discussions.  Team conferences with you and your family in attendance may also be held.  Expected length of stay: 2 weeks  Overall anticipated outcome: supervision  Depending on your progress and recovery, your program may change. Your Social Worker will coordinate services and will keep you informed of any changes. Your Social Worker's name and contact numbers are listed  below.  The following services may also be recommended but are not provided by the Inpatient Rehabilitation Center:   Driving Evaluations  Home Health Rehabiltiation Services  Outpatient Rehabilitatation Servives    Arrangements will be made to provide these services after discharge if needed.  Arrangements include referral to agencies that provide these services.  Your insurance has been verified to be:  Medicare Your primary doctor is:  Dr. Nicholos Johns  Pertinent information will be shared with your doctor and your insurance company.  Social  Worker:  [place], [place] [phone] or [phone]  Information discussed with and copy given to patient by: Amada Jupiter, 10/30/2012, 2:14 PM

## 2012-10-30 NOTE — Progress Notes (Signed)
Subjective/Complaints: Feels fairly well this am. Low back is a little sore A 12 point review of systems has been performed and if not noted above is otherwise negative.   Objective: Vital Signs: Blood pressure 139/65, pulse 55, temperature 97.9 F (36.6 C), temperature source Oral, resp. rate 18, height 5\' 2"  (1.575 m), weight 57.1 kg (125 lb 14.1 oz), SpO2 97.00%. No results found. No results found for this basename: WBC, HGB, HCT, PLT,  in the last 72 hours No results found for this basename: NA, K, CL, CO, GLUCOSE, BUN, CREATININE, CALCIUM,  in the last 72 hours CBG (last 3)  No results found for this basename: GLUCAP,  in the last 72 hours  Wt Readings from Last 3 Encounters:  10/28/12 57.1 kg (125 lb 14.1 oz)  10/21/12 53.7 kg (118 lb 6.2 oz)  10/21/12 53.7 kg (118 lb 6.2 oz)    Physical Exam:  Constitutional: She is oriented to person, place, and time. She appears well-developed and well-nourished. No distress.  HENT:  Head: Normocephalic and atraumatic.  Right Ear: External ear normal.  Left Ear: External ear normal.  Eyes: Conjunctivae and EOM are normal. Pupils are equal, round, and reactive to light. Right eye exhibits no discharge. Left eye exhibits no discharge.  Neck: Normal range of motion. Neck supple. No JVD present. No tracheal deviation present. No thyromegaly present.  Cardiovascular: Normal rate and regular rhythm. Exam reveals no friction rub. No gallops  No murmur heard.  Pulmonary/Chest: Effort normal and breath sounds normal. No respiratory distress. She has no wheezes. She has no rales. She exhibits no tenderness.  Abdominal: Soft. Bowel sounds are normal. She exhibits no distension. There is no tenderness. There is no rebound.  Musculoskeletal: She exhibits no edema.  Lymphadenopathy:  She has no cervical adenopathy.  Neurological: She is alert and oriented to person, place, and time. Follows all simple commands Mild left sided weakness (4 to 4+). Left  PN and decreased FMC on the left side.---  Sensation grossly intact. No CN findings. Cognitively had basic insight and awareness.---had some difficulties remembering details of events of yesterday with therapies.  CN unremarkable.  Skin: Skin is dry.     Assessment/Plan: 1. Functional deficits secondary to Right parietal IPH, small traumatic SDH (right para falcine area) which require 3+ hours per day of interdisciplinary therapy in a comprehensive inpatient rehab setting. Physiatrist is providing close team supervision and 24 hour management of active medical problems listed below. Physiatrist and rehab team continue to assess barriers to discharge/monitor patient progress toward functional and medical goals. FIM: FIM - Bathing Bathing Steps Patient Completed: Chest;Right Arm;Left Arm;Abdomen;Front perineal area;Buttocks;Right upper leg;Left upper leg;Right lower leg (including foot);Left lower leg (including foot) Bathing: 4: Steadying assist  FIM - Upper Body Dressing/Undressing Upper body dressing/undressing steps patient completed: Thread/unthread right bra strap;Thread/unthread left bra strap;Hook/unhook bra;Thread/unthread left sleeve of pullover shirt/dress;Thread/unthread right sleeve of pullover shirt/dresss;Pull shirt over trunk;Put head through opening of pull over shirt/dress Upper body dressing/undressing: 5: Supervision: Safety issues/verbal cues FIM - Lower Body Dressing/Undressing Lower body dressing/undressing steps patient completed: Thread/unthread right underwear leg;Thread/unthread left underwear leg;Pull underwear up/down;Thread/unthread right pants leg;Thread/unthread left pants leg;Pull pants up/down;Don/Doff right sock;Don/Doff left sock;Don/Doff left shoe;Fasten/unfasten left shoe;Fasten/unfasten right shoe;Don/Doff right shoe;Fasten/unfasten pants Lower body dressing/undressing: 4: Steadying Assist  FIM - Toileting Toileting steps completed by patient: Adjust  clothing prior to toileting;Performs perineal hygiene;Adjust clothing after toileting Toileting: 4: Steadying assist  FIM - Archivist Transfers: 4-To toilet/BSC: Min A (  steadying Pt. > 75%);4-From toilet/BSC: Min A (steadying Pt. > 75%)  FIM - Bed/Chair Transfer Bed/Chair Transfer: 5: Chair or W/C > Bed: Supervision (verbal cues/safety issues);5: Bed > Chair or W/C: Supervision (verbal cues/safety issues)  FIM - Locomotion: Wheelchair Locomotion: Wheelchair: 0: Activity did not occur FIM - Locomotion: Ambulation Locomotion: Ambulation Assistive Devices: Designer, industrial/product Ambulation/Gait Assistance: 4: Min assist Locomotion: Ambulation: 5: Travels 150 ft or more with supervision/safety issues  Comprehension Comprehension Mode: Auditory Comprehension: 3-Understands basic 50 - 74% of the time/requires cueing 25 - 50%  of the time  Expression Expression Mode: Verbal Expression: 4-Expresses basic 75 - 89% of the time/requires cueing 10 - 24% of the time. Needs helper to occlude trach/needs to repeat words.  Social Interaction Social Interaction: 4-Interacts appropriately 75 - 89% of the time - Needs redirection for appropriate language or to initiate interaction.  Problem Solving Problem Solving: 3-Solves basic 50 - 74% of the time/requires cueing 25 - 49% of the time  Memory Memory: 2-Recognizes or recalls 25 - 49% of the time/requires cueing 51 - 75% of the time Medical Problem List and Plan:  1. 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 DVT Prophylaxis/Anticoagulation: Subcutaneous Lovenox 30 mg daily. Monitor platelet counts and any signs of bleeding  3. Pain Management: Ultram as needed. Monitor with increased mobility  4. Mood: Celexa 40 mg daily. Provide emotional support and positive reinforcement. SW to eval. Consider neuropsych eval as well.  5.. Neuropsych: This patient is capable of making decisions on her own  behalf.   -baseline mild dementia likely 6. Progressive weight loss. CT abdomen chest to be reviewed. Workup ongoing rule out tumor. Endoscopy study unremarkable. CEA pending. Weight loss could be born out of depression related to loss of spouse.  7. Positive MRSA nasal nares. Contact precautions  8. COPD. Checking oxygen saturations every shift. Patient quit smoking 24 years ago  9. Chronic suppression of UTI. Continue Macrobid.  No incontinence at present.   LOS (Days) 4 A FACE TO FACE EVALUATION WAS PERFORMED  Yannet Rincon T 10/30/2012 9:04 AM

## 2012-10-30 NOTE — Progress Notes (Signed)
Speech Language Pathology Daily Session Note  Patient Details  Name: Nicole Sparks MRN: [id number] Date of Birth: 09/24/1931  Today's Date: 10/30/2012 Time: 1030-1125 Time Calculation (min): 55 min  Short Term Goals: Week 1: SLP Short Term Goal 1 (Week 1): Pt will demonstrate functional problem solving for basic and familiar tasks with Min A verbal and question cues.  SLP Short Term Goal 2 (Week 1): Pt will demonstrate sustained attention to a functional task for 10 minutes with Min A verbal cues for redirection SLP Short Term Goal 3 (Week 1): Pt will utilize external memory aids to recall new, daily information with Min A visual and question cues.  SLP Short Term Goal 4 (Week 1): Pt will utilize call bell to express wants/needs with Mod I.  SLP Short Term Goal 5 (Week 1): Pt will demonstrate emergent awareness of deficits and verbalize 2 safety strategies with Min A question cues.   Skilled Therapeutic Interventions: Treatment focus on cognitive goals. SLP facilitated session by providing Mod A verbal, visual and question cues for functional problem solving and attention with basic money management and medication management tasks.  Pt demonstrated increased organization with task due to decreased utilization of writing information down which tends to increase confusion and disorganization rather than help.  Pt also required supervision question cues for organization with a calendar task.    FIM:  Comprehension Comprehension Mode: Auditory Comprehension: 5-Understands basic 90% of the time/requires cueing < 10% of the time Expression Expression Mode: Verbal Expression: 4-Expresses basic 75 - 89% of the time/requires cueing 10 - 24% of the time. Needs helper to occlude trach/needs to repeat words. Social Interaction Social Interaction: 4-Interacts appropriately 75 - 89% of the time - Needs redirection for appropriate language or to initiate interaction. Problem Solving Problem Solving:  3-Solves basic 50 - 74% of the time/requires cueing 25 - 49% of the time Memory Memory: 2-Recognizes or recalls 25 - 49% of the time/requires cueing 51 - 75% of the time  Pain Pain Assessment Pain Assessment: No/denies pain Pain Score: 0-No pain  Therapy/Group: Individual Therapy  Dezarae Mcclaran 10/30/2012, 12:17 PM

## 2012-10-30 NOTE — Progress Notes (Signed)
Occupational Therapy Session Note  Patient Details  Name: Nicole Sparks MRN: [id number] Date of Birth: 1932/05/10  Today's Date: 10/30/2012 Time: 1130-1155 Time Calculation (min): 25 min  Skilled Therapeutic Interventions/Progress Updates:   Patient seen this am for 1:1 OT session to address dynamic standing balance and balance in functional situations.  Patient ambulated in room with supervision, able to pick up items from floor, and high shelves without loss of balance.  Patient had greater difficulty with activities requiring one legged stance (L>R)  Patient with tangential speech, often losing track of conversation focus.  Patient very pleasant, and not picking up on errors.  She displays disorganized thought process.    Therapy Documentation Precautions:  Precautions Precautions: Fall Precaution Comments: perceptual difficulties  Restrictions Weight Bearing Restrictions: No   Pain: Pain Assessment Pain Assessment: 0-10 Pain Score: 0-No pain    See FIM for current functional status  Therapy/Group: Individual Therapy  Collier Salina 10/30/2012, 12:03 PM

## 2012-10-30 NOTE — Progress Notes (Signed)
Physical Therapy Session Note  Patient Details  Name: Nicole Sparks MRN: [id number] Date of Birth: December 08, 1931  Today's Date: 10/30/2012 Time: 4098-1191 Time Calculation (min): 51 min   Skilled Therapeutic Interventions/Progress Updates:    This session focused on life tasks for the patient. Trying to simulate things she would do at home: watering the plants in the day room and family room, starting the dishwasher in the ADL kitchen, getting into and out of the car, going up and down the stairs.  All tasks were preformed with close supervision due to balance deficits and pt did not use AD during gait.  Path finding task to get back to her room takes extra time (more because pt has difficulty finding signs to tell her were to go rather than her ability to interpret information on the sign).  She seems to have no recall of visual landmarks around the department as well (i.e.- this looks like where I came from earlier).  Moderate cues needed to find signs during path finding activity.    Therapy Documentation Precautions:  Precautions Precautions: Fall Precaution Comments: perceptual difficulties  Restrictions Weight Bearing Restrictions: No General:   Vital Signs:   Pain: Pain Assessment Pain Assessment: No/denies pain Pain Score: 0-No pain   Locomotion : Ambulation Ambulation/Gait Assistance: 5: Supervision   See FIM for current functional status  Therapy/Group: Individual Therapy  Lurena Joiner B. Rajan Burgard, PT, DPT [phone]   10/30/2012, 12:43 PM

## 2012-10-30 NOTE — Progress Notes (Signed)
Occupational Therapy Session Note  Patient Details  Name: Nicole Sparks MRN: [id number] Date of Birth: 05-03-1932  Today's Date: 10/30/2012 Time: 0830-0925 Time Calculation (min): 55 min  Short Term Goals: Week 1:  OT Short Term Goal 1 (Week 1): STG=LTG  Skilled Therapeutic Interventions/Progress Updates:    1:1 self care retraining at sink level (pt choice). Focus on functional ambulation without AD around room, sequencing, basic problem solving with subtle cues, standing balance, visual scanning and attention to left (with clothes and other items places on bedside table on her left, working memory still requiring mod A (in the moment she forgets what she said she was going to do- with little awareness of this), d/c planning, and using schedule to keep organized with daily activities. Pt continues to require min cuing for mistakes locating or donning clothing due to perceptual challenges and left inattention.( ie Pt keeps her positioned to the right of the sink when standing)  Therapy Documentation Precautions:  Precautions Precautions: Fall Precaution Comments: perceptual difficulties  Restrictions Weight Bearing Restrictions: No Pain: Pain Assessment Pain Assessment: 0-10 Pain Score: 0-No pain  See FIM for current functional status  Therapy/Group: Individual Therapy  Roney Mans [facility] 10/30/2012, 10:54 AM

## 2012-10-30 NOTE — Progress Notes (Signed)
Social Work Patient ID: Nicole Sparks, female   DOB: 12-18-1931, 79 y.o.   MRN: [id number]  Late note:  Reviewed team conference info with daughter on Wed. Including targeted d/c of 11/04/12.  Daughter continuing to work on arranging 24/7 supervision for mother.  Daughter aware that referral being made to neuropsych services and interested in getting this report.  Will continue to follow.  Boyd Litaker, LCSW

## 2012-10-31 ENCOUNTER — Inpatient Hospital Stay (HOSPITAL_COMMUNITY): Payer: Medicare Other | Admitting: Physical Therapy

## 2012-10-31 ENCOUNTER — Inpatient Hospital Stay (HOSPITAL_COMMUNITY): Payer: Medicare Other | Admitting: *Deleted

## 2012-10-31 ENCOUNTER — Inpatient Hospital Stay (HOSPITAL_COMMUNITY): Payer: Medicare Other | Admitting: Speech Pathology

## 2012-10-31 DIAGNOSIS — I619 Nontraumatic intracerebral hemorrhage, unspecified: Secondary | ICD-10-CM

## 2012-10-31 DIAGNOSIS — G2581 Restless legs syndrome: Secondary | ICD-10-CM

## 2012-10-31 DIAGNOSIS — D509 Iron deficiency anemia, unspecified: Secondary | ICD-10-CM

## 2012-10-31 MED ORDER — NORTRIPTYLINE HCL 10 MG PO CAPS
10.0000 mg | ORAL_CAPSULE | Freq: Every day | ORAL | Status: DC
  Administered 2012-10-31 – 2012-11-03 (×4): 10 mg via ORAL
  Filled 2012-10-31 (×5): qty 1

## 2012-10-31 NOTE — Consult Note (Signed)
NEUROCOGNITIVE TESTING - [facility]   [name] is a 79 year old, right-handed, Caucasian woman, who was seen for a brief neuropsychological assessment to evaluate cognitive and emotional functioning post-stroke.  According to her medical record, she was admitted on 10/21/12 with dizziness and gait difficulty.  MRI of the brain revealed late subacute intraparenchymal hemorrhage.  By report, she had been experiencing 3 weeks of multiple falls and poor balance as well as 45 pound weight loss over the prior 5 months.  Her medical history is also significant for COPD.    PROCEDURES: [3 units of 16109 on 10/29/2012]  The following tests were performed during today's visit: Mini Mental Status Examination -2 (Brief version), Repeatable Battery for the Assessment of Neuropsychological Status (RBANS, form A, Geriatric Anxiety Inventory, and the Geriatric Depression Scale (short form).  Test results are as follows:   MMSE-2 brief Raw Score = 12 Description = Borderline   RBANS Indices Scaled Score Percentile Description  Immediate Memory  76 5 Impaired  Visuospatial/Constructional n/a n/a n/a  Language 85 16 Below Average  Attention 60 < 1 Profoundly Impaired   Delayed Memory 71 3 Impaired  Total Score n/a n/a n/a   RBANS Subtests Raw Score Percentile Description  List Learning 14 1 Profoundly Impaired  Story Memory 14 18 Below Average  Figure Copy 15 5 Impaired  Line Orientation n/a n/a n/a  Picture Naming 9 19 Below Average  Semantic Fluency 10 3 Impaired  Digit Span 8 16 Below Average  Coding 8 < 1 Profoundly Impaired  List Recall 3 21 Below Average  List Recognition 16 < 1 Profoundly Impaired   Story Recall 5 3 Impaired  Figure recall 7 9 Impaired   GDS (short) 1/15 Description = WNL  [facility] 4/20 Description = WNL   Test results revealed performances that were below expectation across domains.  Most notable impairments were seen in aspects  of learning and memory, visuospatial abilities, and processing speed.  Slowed processing speed also affected her scores on measures of verbal fluency and complex attention.  Of note, Nicole Sparks demonstrated difficulty comprehending abstract test instructions and as such, one measure of ability to make fine visual distinctions had to be discontinued.  Nicole Sparks' total score on an overall measure of mental status was at the cutoff used to indicate the presence of dementia.  Subjectively, Nicole Sparks reported noticing difficulties with memory, but said that her memory seemed to be improving over time.    From an emotional standpoint, Nicole Sparks' responses to self-report measures of mood symptoms were not suggestive of the presence of significant depression or anxiety.  Subjectively, Nicole Sparks reported experiencing depression and anxiety when she first arrived on the unit, but stated that her mood has improved and she is now "working it to get well."  At times, Nicole Sparks seemed frustrated with testing but was cooperative.  She expressed some concern about the expected timeline and course of recovery.  We discussed the course of recovery that is expected with stroke and she expressed relief about having the information.    IMPRESSIONS:  Nicole Sparks' overall cognitive functioning was impaired in multiple areas, to a degree that could represent the presence of dementia.  It is likely that many or all of her demonstrated areas of weakness are secondary to stroke.  Her report of improvement in symptoms over time would be consistent with that as well and she should continue to experience cognitive recovery.  At this time,  there does not appear to be significant mood disruption that is impacting her cognitive abilities.    In light of these findings, the following recommendations are provided:   RECOMMENDATIONS:  Recommendations for treatment team:     Nicole Sparks demonstrated significant difficulty comprehending abstract  instructions.  Her treatment team should strive to use clear and concise instructions whenever possible.      Nicole Sparks' processing speed is slow and seems to be adversely affecting her ability to perform other tasks at times.  Her treatment team should be aware that it will take her longer than normal to comprehend instructions and complete tasks.  They should allow her extra time when performing activities and should wait until she has verbally responded to one instruction before providing additional instructions.      Nicole Sparks' immediate memory was improved when information was provided within a context.  Her treatment team may find benefit in framing important information within a story or context whenever possible, to improve her immediate ability to recall it.    Owing to demonstrated memory difficulties, Nicole Sparks' treatment team should be prepared to repeat instructions and information multiple times.      To the extent possible, multitasking should be avoided.    Performance will generally be best in a structured, routine, and familiar environment, as opposed to situations involving complex problems.   Recommendations for discharge planning:     Complete a comprehensive neuropsychological evaluation in 6-12 months post-stroke.  Per her request, Dr. Georjean Mode contact information should be provided in her discharge paperwork.      Owing to visuospatial and processing speed deficits, it is recommended that Nicole Sparks refrain from driving at this time.      Maintain engagement in mentally, physically and cognitively stimulating activities.     Strive to maintain a healthy lifestyle (e.g., proper diet and exercise) in order to promote physical, cognitive and emotional health.   Leavy Cella, Psy.D.  Clinical Neuropsychologist

## 2012-10-31 NOTE — Progress Notes (Signed)
Physical Therapy Note  Patient Details  Name: Nicole Sparks MRN: [id number] Date of Birth: [birth year] Today's Date: 10/31/2012  1300-1355 (55 minutes) individual Pain: No complaint of pain Focus of treatment: Therapeutic exercise focused on activity tolerance; gait training; therapeutic activities focused on facilitation of hip/ankle/protective balance reactions Treatment: Gait 120 feet X 2 close SBA on level tile; Nustep Level 4 x 5 minutes before c/o fatigue; standing balance- standing on soft surface x 3 minutes to facilitate ankle strategy close SBA; stepping over flat object on floor alternating LE x 10  with loss of balance x 3; standing in tandem stance min assist for balance with decreased hip/ weight shift strategies; single leg stance X 5 seconds with at least one UE assist; alternate stepping to 4 inch step without UE support (min assist for balance) with decreased stance time on left LE; gait min assist forward/backwards/sideways min assist with decreased step length with retropulsion.    Irvan Tiedt,JIM 10/31/2012, 1:09 PM

## 2012-10-31 NOTE — Progress Notes (Signed)
Nicole Sparks is a 79 y.o. female 1931/11/11 [id number]  Subjective: C/o B knee OA pains. No new problems. Did not sleep well. Feeling OK.  Objective: Vital signs in last 24 hours: Temp:  [98.8 F (37.1 C)-99 F (37.2 C)] 99 F (37.2 C) (05/31 0700) Pulse Rate:  [64-72] 72 (05/31 0700) Resp:  [18] 18 (05/31 0700) BP: (114-117)/(60-64) 114/60 mmHg (05/31 0700) SpO2:  [18 %-99 %] 99 % (05/31 0700) Weight change:  Last BM Date: 10/30/12  Intake/Output from previous day: 05/30 0701 - 05/31 0700 In: 720 [P.O.:720] Out: 800 [Urine:800] Last cbgs: CBG (last 3)  No results found for this basename: GLUCAP,  in the last 72 hours   Physical Exam General: No apparent distress    HEENT: moist mucosa Lungs: Normal effort. Lungs clear to auscultation, no crackles or wheezes. Cardiovascular: Regular rate and rhythm, no edema Musculoskeletal:  No change from before - B knees NT Neurological: No new neurological deficits Wounds: N/A    Skin: clear Alert, cooperative   Lab Results: BMET    Component Value Date/Time   NA 132* 10/27/2012 0530   K 4.1 10/27/2012 0530   CL 97 10/27/2012 0530   CO2 24 10/27/2012 0530   GLUCOSE 95 10/27/2012 0530   BUN 18 10/27/2012 0530   CREATININE 0.94 10/27/2012 0530   CALCIUM 8.9 10/27/2012 0530   GFRNONAA 56* 10/27/2012 0530   GFRAA 65* 10/27/2012 0530   CBC    Component Value Date/Time   WBC 7.4 10/27/2012 0530   RBC 4.07 10/27/2012 0530   HGB 10.3* 10/27/2012 0530   HCT 32.9* 10/27/2012 0530   PLT 253 10/27/2012 0530   MCV 80.8 10/27/2012 0530   MCH 25.3* 10/27/2012 0530   MCHC 31.3 10/27/2012 0530   RDW 16.7* 10/27/2012 0530   LYMPHSABS 2.1 10/27/2012 0530   MONOABS 1.0 10/27/2012 0530   EOSABS 0.3 10/27/2012 0530   BASOSABS 0.0 10/27/2012 0530    Studies/Results: No results found.  Medications: I have reviewed the patient's current medications.  Assessment/Plan:  1. Right parietal intraparenchymal hemorrhage, small subdural hematoma likely  traumatic  2. DVT Prophylaxis/Anticoagulation: Subcutaneous Lovenox 30 mg daily. Monitor platelet counts and any signs of bleeding  3. Pain Management: Ultram as needed. Monitor with increased mobility  4. Mood: Celexa 40 mg daily. Provide emotional support and positive reinforcement. SW to eval. Consider neuropsych eval as well.  5.. Neuropsych: This patient is capable of making decisions on her own behalf.  -baseline mild dementia likely  6. Progressive weight loss. CT abdomen chest to be reviewed. Workup ongoing rule out tumor. Endoscopy study unremarkable. CEA pending. Weight loss could be born out of depression related to loss of spouse.  7. Positive MRSA nasal nares. Contact precautions  8. COPD. Checking oxygen saturations every shift. Patient quit smoking 24 years ago  9. Chronic suppression of UTI. Continue Macrobid. No incontinence at present. 10. Insomnia. See meds 11. B knee OA - prn meds     Length of stay, days: 5  Sonda Primes , MD 10/31/2012, 9:07 AM

## 2012-10-31 NOTE — Progress Notes (Signed)
Occupational Therapy Note  Patient Details  Name: Nicole Sparks MRN: [id number] Date of Birth: 03-30-1932 Today's Date: 10/31/2012 Time:  1615-1700  (45 min) Individual session Pain:  None  Engaged in functional mobility, balance, problem solving, path finding, memory.  Pt. Checking schedule to make sure she did not have any more sessions today.  Pt ambulated to gym.  Engaged in balance activities (Wii, and biodex).  Pt had difficulty with coordinating the trigger button and also needed max instructional cues on holding to the remote.  On biodex, she used upper body as balance strategy and kept most of her weight on the left back heel.  Pt. Able to find her room with signs.      Humberto Seals 10/31/2012, 4:24 PM

## 2012-10-31 NOTE — Progress Notes (Signed)
OccupationalTherapy Note  Patient Details  Name: Nicole Sparks MRN: [id number] Date of Birth: Mar 21, 1932 Today's Date: 10/31/2012  Time:  1030-1130  (60 min)   Pain:  None Individual session Self care retraining at sink level (pt choice).  Addressed functional mobility around room with supervision to minimal assist, sequencing, basic problem solving, balance, visual scanning attention to the left.Pt. Exhibited decreased working memory throughout the session.  She would walk in bathroom and turn to go out towards the shower or forget where she put her schedule.  Pt had decreased organization and sequencing with tasks.  She would walk back and forth rather than put item on table right by the sink.  She had 2 LOB with stepping response.  Pt. Showed decreased awareness of her memory or disorganization issues.  Left patient in recliner chair with safety belt on.       Humberto Seals 10/31/2012, 12:56 PM

## 2012-10-31 NOTE — Progress Notes (Signed)
Speech Language Pathology Daily Session Note  Patient Details  Name: Nicole Sparks MRN: [id number] Date of Birth: 07-Nov-1931  Today's Date: 10/31/2012 Time: 0940-1005 Time Calculation (min): 25 min  Short Term Goals: Week 1: SLP Short Term Goal 1 (Week 1): Pt will demonstrate functional problem solving for basic and familiar tasks with Min A verbal and question cues.  SLP Short Term Goal 2 (Week 1): Pt will demonstrate sustained attention to a functional task for 10 minutes with Min A verbal cues for redirection SLP Short Term Goal 3 (Week 1): Pt will utilize external memory aids to recall new, daily information with Min A visual and question cues.  SLP Short Term Goal 4 (Week 1): Pt will utilize call bell to express wants/needs with Mod I.  SLP Short Term Goal 5 (Week 1): Pt will demonstrate emergent awareness of deficits and verbalize 2 safety strategies with Min A question cues.   Skilled Therapeutic Interventions: Therapeutic intervention for cognitive skills goals completed.  Patient required Min A verbal cues to sustain attention to task during activities. She required mod A verbal cues to identify safety strategies and also to recognize the importance of these strategies.  In addition, she required mod A verbal cues to successfully problem solve potentially dangerous scenarios.  Continue with current treatment plan.    FIM:  Comprehension Comprehension: 5-Understands complex 90% of the time/Cues < 10% of the time Expression Expression Mode: Verbal Expression: 5-Expresses basic needs/ideas: With extra time/assistive device Social Interaction Social Interaction: 4-Interacts appropriately 75 - 89% of the time - Needs redirection for appropriate language or to initiate interaction. Problem Solving Problem Solving: 3-Solves basic 50 - 74% of the time/requires cueing 25 - 49% of the time Memory Memory: 2-Recognizes or recalls 25 - 49% of the time/requires cueing 51 - 75% of the  time  Pain Pain Assessment Pain Assessment: No/denies pain  Therapy/Group: Individual Therapy  Lenny Pastel 10/31/2012, 11:36 AM

## 2012-11-01 ENCOUNTER — Inpatient Hospital Stay (HOSPITAL_COMMUNITY): Payer: Medicare Other | Admitting: Physical Therapy

## 2012-11-01 DIAGNOSIS — G4700 Insomnia, unspecified: Secondary | ICD-10-CM

## 2012-11-01 DIAGNOSIS — R634 Abnormal weight loss: Secondary | ICD-10-CM

## 2012-11-01 NOTE — Progress Notes (Signed)
Nicole Sparks is a 79 y.o. female [birth year] [id number]  Subjective: C/o B knee OA pains. No new problems. Slept well on Pamelor. Feeling OK.  Objective: Vital signs in last 24 hours: Temp:  [97.7 F (36.5 C)] 97.7 F (36.5 C) (06/01 0645) Pulse Rate:  [62] 62 (06/01 0645) Resp:  [17] 17 (06/01 0645) BP: (126)/(78) 126/78 mmHg (06/01 0645) SpO2:  [98 %] 98 % (06/01 0645) Weight change:  Last BM Date: 10/31/12  Intake/Output from previous day: 05/31 0701 - 06/01 0700 In: 720 [P.O.:720] Out: 1575 [Urine:1575] Last cbgs: CBG (last 3)  No results found for this basename: GLUCAP,  in the last 72 hours   Physical Exam General: No apparent distress    HEENT: moist mucosa Lungs: Normal effort. Lungs clear to auscultation, no crackles or wheezes. Cardiovascular: Regular rate and rhythm, no edema Musculoskeletal:  No change from before - B knees NT Neurological: No new neurological deficits Wounds: N/A    Skin: clear Alert, cooperative   Lab Results: BMET    Component Value Date/Time   NA 132* 10/27/2012 0530   K 4.1 10/27/2012 0530   CL 97 10/27/2012 0530   CO2 24 10/27/2012 0530   GLUCOSE 95 10/27/2012 0530   BUN 18 10/27/2012 0530   CREATININE 0.94 10/27/2012 0530   CALCIUM 8.9 10/27/2012 0530   GFRNONAA 56* 10/27/2012 0530   GFRAA 65* 10/27/2012 0530   CBC    Component Value Date/Time   WBC 7.4 10/27/2012 0530   RBC 4.07 10/27/2012 0530   HGB 10.3* 10/27/2012 0530   HCT 32.9* 10/27/2012 0530   PLT 253 10/27/2012 0530   MCV 80.8 10/27/2012 0530   MCH 25.3* 10/27/2012 0530   MCHC 31.3 10/27/2012 0530   RDW 16.7* 10/27/2012 0530   LYMPHSABS 2.1 10/27/2012 0530   MONOABS 1.0 10/27/2012 0530   EOSABS 0.3 10/27/2012 0530   BASOSABS 0.0 10/27/2012 0530    Studies/Results: No results found.  Medications: I have reviewed the patient's current medications.  Assessment/Plan:  1. 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 DVT  Prophylaxis/Anticoagulation: Subcutaneous Lovenox 30 mg daily. Monitor platelet counts and any signs of bleeding  3. Pain Management: Ultram as needed. Monitor with increased mobility  4. Mood: Celexa 40 mg daily. Provide emotional support and positive reinforcement. SW to eval. Consider neuropsych eval as well.  5.. Neuropsych: This patient is capable of making decisions on her own behalf.  -baseline mild dementia likely  6. Progressive weight loss. CT abdomen chest to be reviewed. Workup ongoing rule out tumor. Endoscopy study unremarkable. CEA pending. Weight loss could be born out of depression related to loss of spouse.  7. Positive MRSA nasal nares. Contact precautions  8. COPD. Checking oxygen saturations every shift. Patient quit smoking 24 years ago  9. Chronic suppression of UTI. Continue Macrobid. No incontinence at present. 10. Insomnia. See meds - better 11. B knee OA - prn meds     Length of stay, days: 6  Sonda Primes , MD 11/01/2012, 8:51 AM

## 2012-11-01 NOTE — Progress Notes (Signed)
Physical Therapy Note  Patient Details  Name: Nicole Sparks MRN: [id number] Date of Birth: 1931-12-09 Today's Date: 11/01/2012  4098-1191 (55 minutes) individual Pain: no complaint of pain Other: no complaint of dizziness Focus of treatment: Therapeutic exercise focused on bilateral LE strengthening/activity tolerance; therapeutic activities focused on standing balance; gait training/eneurance Treatment: Pt in bed upon arrival; pt donned slacks independently in supine and donned shoes independently in sitting; pt ambulates in room with close SBA brushing hair and applying makeup in standing; pt intermittently has decreased DF on left with foot catching; Gait 120 feet X 2 close SBA on unit; Nustep Level 4 X 5 minutes for activity tolerance; gait over obstacle course (1 step/ stepping over obstacles/ around cones/ picking up object from floor x 2 with close SBA and intermittent minimal stagger to right; standing therapeutic exercises with bilateral UE support X 15- marching in place, hip abduction, toe raises, up on heels, up/down 6 inch step alternating LEs.   Johnnisha Forton,JIM 11/01/2012, 9:17 AM

## 2012-11-02 ENCOUNTER — Inpatient Hospital Stay (HOSPITAL_COMMUNITY): Payer: Medicare Other

## 2012-11-02 ENCOUNTER — Inpatient Hospital Stay (HOSPITAL_COMMUNITY): Payer: Medicare Other | Admitting: Speech Pathology

## 2012-11-02 ENCOUNTER — Inpatient Hospital Stay (HOSPITAL_COMMUNITY): Payer: Medicare Other | Admitting: Physical Therapy

## 2012-11-02 ENCOUNTER — Inpatient Hospital Stay (HOSPITAL_COMMUNITY): Payer: Medicare Other | Admitting: Occupational Therapy

## 2012-11-02 LAB — CREATININE, SERUM
Creatinine, Ser: 1.06 mg/dL (ref 0.50–1.10)
GFR calc non Af Amer: 49 mL/min — ABNORMAL LOW (ref >90)

## 2012-11-02 NOTE — Progress Notes (Signed)
Physical Therapy Session Note  Patient Details  Name: Nicole Sparks MRN: [id number] Date of Birth: 1932/04/25  Today's Date: 11/02/2012 Time: 1400-1445 45 minutes  Skilled Therapeutic Interventions/Progress Updates:    no c/o pain.  Balance training exercises: [place] HEP performed for LE strengthening and fall prevention.  Household/functional gait and mobility with laundry and kitchen tasks including bending, reaching and carrying items with supervision, pt able to self correct any slight LOB, improved thought organization noted.  Berg balance re-test.  Pt improved to 42/56, still at risk for falls, 24 hour supervision recommended at home.  Therapy Documentation   Balance: Berg Balance Test Sit to Stand: Able to stand  independently using hands Standing Unsupported: Able to stand safely 2 minutes Sitting with Back Unsupported but Feet Supported on Floor or Stool: Able to sit safely and securely 2 minutes Stand to Sit: Sits safely with minimal use of hands Transfers: Able to transfer safely, minor use of hands Standing Unsupported with Eyes Closed: Able to stand 10 seconds with supervision Standing Ubsupported with Feet Together: Able to place feet together independently and stand for 1 minute with supervision From Standing, Reach Forward with Outstretched Arm: Can reach forward >12 cm safely (5") From Standing Position, Pick up Object from Floor: Able to pick up shoe safely and easily From Standing Position, Turn to Look Behind Over each Shoulder: Looks behind from both sides and weight shifts well Turn 360 Degrees: Able to turn 360 degrees safely but slowly Standing Unsupported, Alternately Place Feet on Step/Stool: Able to complete >2 steps/needs minimal assist Standing Unsupported, One Foot in Front: Able to take small step independently and hold 30 seconds Standing on One Leg: Tries to lift leg/unable to hold 3 seconds but remains standing independently Total Score: 42  See FIM for  current functional status  Therapy/Group: Individual Therapy  DONAWERTH,KAREN 11/02/2012, 2:19 PM

## 2012-11-02 NOTE — Progress Notes (Signed)
Physical Therapy Note  Patient Details  Name: Brennah Quraishi MRN: [id number] Date of Birth: 03-06-1932 Today's Date: 11/02/2012  1530-1625 (55 minutes) individual Pain: no reported pain Focus of treatment: Gait on unit 150 feet SBA; Nustep Level 3 (2 X 5 minutes) for activity tolerance; therapeutic exercise- standing alternate hip flexion to 12 inch height 2 X 10 with UE support; standing bilateral Hip abduction; heel raises, toe raises, supine ankle circles; gait on unlevel surfaces >500 feet including up 5 steps X 3 with rail right close SBA; pt able to follow signs and return to unit without any cues. Intermittent foot drag on left with fatigue.     Mekai Wilkinson,JIM 11/02/2012, 3:37 PM

## 2012-11-02 NOTE — Progress Notes (Signed)
Speech Language Pathology Daily Session Note  Patient Details  Name: Nicole Sparks MRN: [id number] Date of Birth: 1931-10-03  Today's Date: 11/02/2012 Time: 4098-1191 Time Calculation (min): 45 min  Short Term Goals: Week 1: SLP Short Term Goal 1 (Week 1): Pt will demonstrate functional problem solving for basic and familiar tasks with Min A verbal and question cues.  SLP Short Term Goal 2 (Week 1): Pt will demonstrate sustained attention to a functional task for 10 minutes with Min A verbal cues for redirection SLP Short Term Goal 3 (Week 1): Pt will utilize external memory aids to recall new, daily information with Min A visual and question cues.  SLP Short Term Goal 4 (Week 1): Pt will utilize call bell to express wants/needs with Mod I.  SLP Short Term Goal 5 (Week 1): Pt will demonstrate emergent awareness of deficits and verbalize 2 safety strategies with Min A question cues.   Skilled Therapeutic Interventions: Treatment focus on cognitive goals in regards to memory and organization.  SLP facilitated session by providing Max verbal and visual cues which eventually faded to Min cues for mildly complex organizational task in regards to money management.  Pt also required Mod question cues to recall functions of current medications.    FIM:  Comprehension Comprehension Mode: Auditory Comprehension: 4-Understands basic 75 - 89% of the time/requires cueing 10 - 24% of the time Expression Expression Mode: Verbal Expression: 5-Expresses basic needs/ideas: With extra time/assistive device Social Interaction Social Interaction: 4-Interacts appropriately 75 - 89% of the time - Needs redirection for appropriate language or to initiate interaction. Problem Solving Problem Solving: 3-Solves basic 50 - 74% of the time/requires cueing 25 - 49% of the time Memory Memory: 3-Recognizes or recalls 50 - 74% of the time/requires cueing 25 - 49% of the time FIM - Eating Eating Activity: 7: Complete  independence:no helper  Pain Pain Assessment Pain Assessment: No/denies pain   Therapy/Group: Individual Therapy  Nicole Sparks 11/02/2012, 5:10 PM

## 2012-11-02 NOTE — Progress Notes (Addendum)
Physical Therapy Session Note  Patient Details  Name: Nicole Sparks MRN: [id number] Date of Birth: [birth year]  Today's Date: 11/02/2012 Time: 1105-1205 Time Calculation (min): 60 min  Short Term Goals: Week 1: LTGs         Skilled Therapeutic Interventions/Progress Updates:  Treatment focused on gait on a variety of surfaces, toilet transfer, functional activity.  Pt requested walking outside.  Pt performed gait x 300' without AD, supervision, with frequent cueing questions for route finding to unfamiliar areas.  Pt spontaneously used wall signs to orient herself, but was confused by any halls off to the right, and required min cueing throughout session.   Cognitive task while ambulating: naming fruits or vegetables.  Pt had difficulty with concurrent task, and asked to stop after naming 4 items.  She verbalized that she cannot talk and walk at the same time.   Pt pushed and pulled open heavy fire doors with min assist, negotiated incline and decline outdoors on concrete with min guard assist.   Pt noted that her urostomy was leaking.  Returned to room; pt located and changed urostomy bad with some assist from [employer]. She stood for clothing change with supervision.  Doffed and donned shoes in sitting , supervision.  Toilet transfer and toileting with supervision.    Gait outdoors as above, plus up/down 8 outdoor steps with 1 rail, 2 hands, close supervision.  Sit>< stand on bench with supervision.  Returned to room, transferred to recliner with supervision.  Safety belt applied and all items set within reach.  Lunch tray set up for pt.  neuromuscular re-education via forced use, demo for Fall Prevention 1 exs: calf raises, modified L and R tandem mini squats, alternating hamstring curls with min assist for balance    Therapy Documentation Precautions:  Precautions Precautions: Fall Precaution Comments: perceptual difficulties  Restrictions Weight Bearing Restrictions: No   Pain: Pain  Assessment Pain Assessment: 0-10 Pain Score:   7 Pain Location: Leg Pain Orientation: Right;Left Pain Descriptors / Indicators: Aching Pain Onset: Gradual Patients Stated Pain Goal: 2 Pain Intervention(s): Medication (See eMAR)   Locomotion : Ambulation Ambulation/Gait Assistance: 5: Supervision     See FIM for current functional status  Therapy/Group: Individual Therapy  Myrtis Maille 11/02/2012, 3:36 PM

## 2012-11-02 NOTE — Progress Notes (Signed)
Subjective/Complaints: "when do I go home?" denies other complaints A 12 point review of systems has been performed and if not noted above is otherwise negative.   Objective: Vital Signs: Blood pressure 122/76, pulse 66, temperature 97.9 F (36.6 C), temperature source Oral, resp. rate 18, height 5\' 2"  (1.575 m), weight 57.1 kg (125 lb 14.1 oz), SpO2 98.00%. No results found. No results found for this basename: WBC, HGB, HCT, PLT,  in the last 72 hours  Recent Labs  11/02/12 0535  CREATININE 1.06   CBG (last 3)  No results found for this basename: GLUCAP,  in the last 72 hours  Wt Readings from Last 3 Encounters:  10/28/12 57.1 kg (125 lb 14.1 oz)  10/21/12 53.7 kg (118 lb 6.2 oz)  10/21/12 53.7 kg (118 lb 6.2 oz)    Physical Exam:  Constitutional: She is oriented to person, place, and time. She appears well-developed and well-nourished. No distress.  HENT:  Head: Normocephalic and atraumatic.  Right Ear: External ear normal.  Left Ear: External ear normal.  Eyes: Conjunctivae and EOM are normal. Pupils are equal, round, and reactive to light. Right eye exhibits no discharge. Left eye exhibits no discharge.  Neck: Normal range of motion. Neck supple. No JVD present. No tracheal deviation present. No thyromegaly present.  Cardiovascular: Normal rate and regular rhythm. Exam reveals no friction rub. No gallops  No murmur heard.  Pulmonary/Chest: Effort normal and breath sounds normal. No respiratory distress. She has no wheezes. She has no rales. She exhibits no tenderness.  Abdominal: Soft. Bowel sounds are normal. She exhibits no distension. There is no tenderness. There is no rebound.  Musculoskeletal: She exhibits no edema.  Lymphadenopathy:  She has no cervical adenopathy.  Neurological: She is alert and oriented to person, place, and time. Follows all simple commands Mild left sided weakness( 4+). Left PN and decreased FMC on the left side.---  Sensation grossly intact.  No CN findings. Cognitively had basic insight and awareness.---had some difficulties remembering details of events of yesterday with therapies.  CN unremarkable.  Skin: Skin is dry.     Assessment/Plan: 1. Functional deficits secondary to Right parietal IPH, small traumatic SDH (right para falcine area) which require 3+ hours per day of interdisciplinary therapy in a comprehensive inpatient rehab setting. Physiatrist is providing close team supervision and 24 hour management of active medical problems listed below. Physiatrist and rehab team continue to assess barriers to discharge/monitor patient progress toward functional and medical goals.  Home Wednesday   FIM: FIM - Bathing Bathing Steps Patient Completed: Chest;Right Arm;Left Arm;Abdomen;Front perineal area;Buttocks;Right upper leg;Left upper leg;Right lower leg (including foot);Left lower leg (including foot) Bathing: 4: Steadying assist  FIM - Upper Body Dressing/Undressing Upper body dressing/undressing steps patient completed: Thread/unthread right bra strap;Thread/unthread left bra strap;Hook/unhook bra;Thread/unthread left sleeve of pullover shirt/dress;Thread/unthread right sleeve of pullover shirt/dresss;Pull shirt over trunk;Put head through opening of pull over shirt/dress Upper body dressing/undressing: 5: Supervision: Safety issues/verbal cues FIM - Lower Body Dressing/Undressing Lower body dressing/undressing steps patient completed: Thread/unthread right underwear leg;Thread/unthread left underwear leg;Pull underwear up/down;Thread/unthread right pants leg;Thread/unthread left pants leg;Pull pants up/down;Don/Doff right sock;Don/Doff left sock;Don/Doff left shoe;Fasten/unfasten left shoe;Fasten/unfasten right shoe;Don/Doff right shoe;Fasten/unfasten pants Lower body dressing/undressing: 4: Steadying Assist  FIM - Toileting Toileting steps completed by patient: Adjust clothing prior to toileting;Performs perineal  hygiene;Adjust clothing after toileting Toileting: 5: Supervision: Safety issues/verbal cues  FIM - Toilet Transfers Toilet Transfers: 5-To toilet/BSC: Supervision (verbal cues/safety issues);5-From toilet/BSC: Supervision (verbal cues/safety issues)  FIM - Banker Devices: Bed rails Bed/Chair Transfer: 5: Bed > Chair or W/C: Supervision (verbal cues/safety issues);5: Chair or W/C > Bed: Supervision (verbal cues/safety issues)  FIM - Locomotion: Wheelchair Locomotion: Wheelchair: 0: Activity did not occur FIM - Locomotion: Ambulation Locomotion: Ambulation Assistive Devices: Designer, industrial/product Ambulation/Gait Assistance: 4: Min guard Locomotion: Ambulation: 4: Travels 150 ft or more with minimal assistance (Pt.>75%)  Comprehension Comprehension Mode: Auditory Comprehension: 4-Understands basic 75 - 89% of the time/requires cueing 10 - 24% of the time  Expression Expression Mode: Verbal Expression: 5-Expresses basic needs/ideas: With extra time/assistive device  Social Interaction Social Interaction: 4-Interacts appropriately 75 - 89% of the time - Needs redirection for appropriate language or to initiate interaction.  Problem Solving Problem Solving: 3-Solves basic 50 - 74% of the time/requires cueing 25 - 49% of the time  Memory Memory: 2-Recognizes or recalls 25 - 49% of the time/requires cueing 51 - 75% of the time Medical Problem List and Plan:  1. 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 DVT Prophylaxis/Anticoagulation: Subcutaneous Lovenox 30 mg daily. Monitor platelet counts and any signs of bleeding  3. Pain Management: Ultram as needed. Monitor with increased mobility  4. Mood: Celexa 40 mg daily. Provide emotional support and positive reinforcement. SW to eval. Consider neuropsych eval as well.  5.. Neuropsych: This patient is capable of making decisions on her own behalf.    -baseline mild dementia likely 6. Progressive weight loss. Cancer work up unremarkable. CEA cancelled.  Weight loss could be born out of depression related to loss of spouse.  7. Positive MRSA nasal nares. Contact precautions  8. COPD. Checking oxygen saturations every shift. Patient quit smoking 24 years ago  9. Chronic suppression of UTI. Continue Macrobid.  No incontinence at present.   LOS (Days) 7 A FACE TO FACE EVALUATION WAS PERFORMED  Nicole Sparks T 11/02/2012 8:21 AM

## 2012-11-02 NOTE — Progress Notes (Signed)
Occupational Therapy Session Note  Patient Details  Name: Angeliki Mates MRN: [id number] Date of Birth: [birth year]  Today's Date: 11/02/2012 Time: 1000-1055 Time Calculation (min): 55 min  Short Term Goals: Week 1:  OT Short Term Goal 1 (Week 1): STG=LTG  Skilled Therapeutic Interventions/Progress Updates:  Self care retraining to include sponge bath (declined shower), empty urostomy bag, dress, groom and take clothes to the washing machine.  Focused session on organization, sequencing, problem solving, memory, and dynamic balance during functional mobility.  Patient able to empty urostomy bag into container while standing, decided to change clothes after getting make-up on her clothes, then ambulated to the patient laundry room to wash her clothes.  Reviewed Graduation Day expectations to prepare patient for tomorrow if she discharges on Wednesday.  Therapy Documentation Precautions:  Precautions Precautions: Fall Precaution Comments: perceptual difficulties  Restrictions Weight Bearing Restrictions: No Pain: Denies pain ADL: See FIM for current functional status  Therapy/Group: Individual Therapy  Jeanmarie Mccowen 11/02/2012, 11:11 AM

## 2012-11-03 ENCOUNTER — Inpatient Hospital Stay (HOSPITAL_COMMUNITY): Payer: Medicare Other | Admitting: Physical Therapy

## 2012-11-03 ENCOUNTER — Encounter (HOSPITAL_COMMUNITY): Payer: Medicare Other | Admitting: Occupational Therapy

## 2012-11-03 ENCOUNTER — Inpatient Hospital Stay (HOSPITAL_COMMUNITY): Payer: Medicare Other | Admitting: Occupational Therapy

## 2012-11-03 ENCOUNTER — Inpatient Hospital Stay (HOSPITAL_COMMUNITY): Payer: Medicare Other | Admitting: Speech Pathology

## 2012-11-03 NOTE — Progress Notes (Signed)
Social Work Patient ID: Nicole Sparks, female   DOB: [birth year], 79 y.o.   MRN: [id number]  Inpatient RehabilitationTeam Conference and Plan of Care Update Date: 11/03/2012   Time: 2:55 PM     Patient Name: Nicole Sparks       Medical Record Number: [id number]   Date of Birth: April 02, 1932 Sex: Female         Room/Bed: 4029/4029-01 Payor Info: Payor: MEDICARE / Plan: MEDICARE PART A AND B / Product Type: *No Product type* /   Admitting Diagnosis: sick   Admit Date/Time:  10/26/2012  2:58 PM Admission Comments: No comment available   Primary Diagnosis:  Intraparenchymal hemorrhage of brain Principal Problem: Intraparenchymal hemorrhage of brain    Patient Active Problem List     Diagnosis  Date Noted   .  Intraparenchymal hemorrhage of brain  10/27/2012   .  Nonspecific (abnormal) findings on radiological and other examination of gastrointestinal tract  10/24/2012   .  Reflux esophagitis  10/24/2012   .  Weight loss, non-intentional  10/23/2012   .  Duodenum disorder  10/23/2012   .  Iron deficiency anemia  10/23/2012   .  Traumatic cerebral intraparenchymal hematoma  10/22/2012   .  Hyponatremia  10/22/2012   .  Restless leg  10/22/2012   .  Urethral stenosis  10/22/2012   .  Recurrent UTI  10/22/2012   .  Metabolic encephalopathy  10/22/2012   .  Recurrent falls  10/22/2012     Expected Discharge Date: Expected Discharge Date: 11/04/12  Team Members Present: Physician leading conference: Dr. Faith Rogue Social Worker Present: Amada Jupiter, LCSW Nurse Present: Carmie End, RN PT Present: Reggy Eye, PT OT Present: Mackie Pai, Marye Round, OT SLP Present: Feliberto Gottron, SLP Other (Discipline and Name): Tora Duck, PPS Coordinator;  Tera Partridge, RN        Current Status/Progress  Goal  Weekly Team Focus   Medical     improving cognition to baseline  see prior  stabilize medically for dc   Bowel/Bladder     Urostomy-patient preforms self care;  continent of bowel LBM-6/2  Remain continent of bowel; continue to self care for urostomy  Continue to monitor   Swallow/Nutrition/ Hydration            ADL's     supervision to mod I    d/c planning, left inattention   Mobility     supervision  supervision  d/c planning   Communication            Safety/Cognition/ Behavioral Observations    supervision  Supervision  D/C home tomorrow. Education complete   Pain     c/o back pain controlled with Tramadol  pain level 3  continue to assess q shift and prn   Skin     skin intact  Skin will remain intact  Continue to assess, and monitor    Rehab Goals Patient on target to meet rehab goals: Yes *See Care Plan and progress notes for long and short-term goals.    Barriers to Discharge:  see prior      Possible Resolutions to Barriers:    see prior      Discharge Planning/Teaching Needs:    home with daughter to provide 24/7 assistance/ supervision      Team Discussion:    Ready for d/c tomorrow.  Good gains, however, cognition still somewhat impaired.  Has her own opinions about safety.  Recommend OPPT for continued work on  gait/balance.   Revisions to Treatment Plan:    None    Continued Need for Acute Rehabilitation Level of Care: The patient requires daily medical management by a physician with specialized training in physical medicine and rehabilitation for the following conditions: Daily direction of a multidisciplinary physical rehabilitation program to ensure safe treatment while eliciting the highest outcome that is of practical value to the patient.: Yes Daily medical management of patient stability for increased activity during participation in an intensive rehabilitation regime.: Yes Daily analysis of laboratory values and/or radiology reports with any subsequent need for medication adjustment of medical intervention for : Neurological problems;Other  Issac Moure 11/03/2012, 5:01 PM

## 2012-11-03 NOTE — Progress Notes (Signed)
Subjective/Complaints: Slept well. Anxious to go home A 12 point review of systems has been performed and if not noted above is otherwise negative.   Objective: Vital Signs: Blood pressure 112/68, pulse 75, temperature 98 F (36.7 C), temperature source Oral, resp. rate 18, height 5\' 2"  (1.575 m), weight 57.1 kg (125 lb 14.1 oz), SpO2 99.00%. No results found. No results found for this basename: WBC, HGB, HCT, PLT,  in the last 72 hours  Recent Labs  11/02/12 0535  CREATININE 1.06   CBG (last 3)  No results found for this basename: GLUCAP,  in the last 72 hours  Wt Readings from Last 3 Encounters:  10/28/12 57.1 kg (125 lb 14.1 oz)  10/21/12 53.7 kg (118 lb 6.2 oz)  10/21/12 53.7 kg (118 lb 6.2 oz)    Physical Exam:  Constitutional: She is oriented to person, place, and time. She appears well-developed and well-nourished. No distress.  HENT:  Head: Normocephalic and atraumatic.  Right Ear: External ear normal.  Left Ear: External ear normal.  Eyes: Conjunctivae and EOM are normal. Pupils are equal, round, and reactive to light. Right eye exhibits no discharge. Left eye exhibits no discharge.  Neck: Normal range of motion. Neck supple. No JVD present. No tracheal deviation present. No thyromegaly present.  Cardiovascular: Normal rate and regular rhythm. Exam reveals no friction rub. No gallops  No murmur heard.  Pulmonary/Chest: Effort normal and breath sounds normal. No respiratory distress. She has no wheezes. She has no rales. She exhibits no tenderness.  Abdominal: Soft. Bowel sounds are normal. She exhibits no distension. There is no tenderness. There is no rebound.  Musculoskeletal: She exhibits no edema.  Lymphadenopathy:  She has no cervical adenopathy.  Neurological: She is alert and oriented to person, place, and time. Follows all simple commands Mild left sided weakness( 4+). Left PN and decreased FMC on the left side.---  Sensation grossly intact. No CN findings.  Cognitively had basic insight and awareness.-  CN unremarkable.  Skin: Skin is dry.     Assessment/Plan: 1. Functional deficits secondary to Right parietal IPH, small traumatic SDH (right para falcine area) which require 3+ hours per day of interdisciplinary therapy in a comprehensive inpatient rehab setting. Physiatrist is providing close team supervision and 24 hour management of active medical problems listed below. Physiatrist and rehab team continue to assess barriers to discharge/monitor patient progress toward functional and medical goals.  Home Wednesday   FIM: FIM - Bathing Bathing Steps Patient Completed: Chest;Right Arm;Left Arm;Abdomen;Front perineal area;Buttocks;Right upper leg;Left upper leg;Right lower leg (including foot);Left lower leg (including foot) Bathing: 5: Supervision: Safety issues/verbal cues  FIM - Upper Body Dressing/Undressing Upper body dressing/undressing steps patient completed: Thread/unthread right bra strap;Thread/unthread left bra strap;Hook/unhook bra;Thread/unthread right sleeve of pullover shirt/dresss;Thread/unthread left sleeve of pullover shirt/dress;Put head through opening of pull over shirt/dress;Pull shirt over trunk Upper body dressing/undressing: 5: Supervision: Safety issues/verbal cues FIM - Lower Body Dressing/Undressing Lower body dressing/undressing steps patient completed: Thread/unthread right underwear leg;Thread/unthread left underwear leg;Pull underwear up/down;Thread/unthread right pants leg;Thread/unthread left pants leg;Pull pants up/down;Fasten/unfasten pants;Don/Doff right sock;Don/Doff left sock;Don/Doff right shoe;Don/Doff left shoe;Fasten/unfasten right shoe;Fasten/unfasten left shoe Lower body dressing/undressing: 5: Supervision: Safety issues/verbal cues  FIM - Toileting Toileting steps completed by patient: Adjust clothing prior to toileting;Performs perineal hygiene;Adjust clothing after toileting Toileting: 5:  Supervision: Safety issues/verbal cues  FIM - Archivist Transfers Assistive Devices: Grab bars Toilet Transfers: 5-From toilet/BSC: Supervision (verbal cues/safety issues);5-To toilet/BSC: Supervision (verbal cues/safety issues)  FIM -  Bed/Chair Transport planner Devices: Bed rails Bed/Chair Transfer: 5: Bed > Chair or W/C: Supervision (verbal cues/safety issues);5: Chair or W/C > Bed: Supervision (verbal cues/safety issues)  FIM - Locomotion: Wheelchair Locomotion: Wheelchair: 0: Activity did not occur FIM - Locomotion: Ambulation Locomotion: Ambulation Assistive Devices:  (no AD) Ambulation/Gait Assistance: 5: Supervision Locomotion: Ambulation: 5: Travels 150 ft or more with supervision/safety issues  Comprehension Comprehension Mode: Auditory Comprehension: 4-Understands basic 75 - 89% of the time/requires cueing 10 - 24% of the time  Expression Expression Mode: Verbal Expression: 5-Expresses basic needs/ideas: With no assist  Social Interaction Social Interaction: 4-Interacts appropriately 75 - 89% of the time - Needs redirection for appropriate language or to initiate interaction.  Problem Solving Problem Solving: 3-Solves basic 50 - 74% of the time/requires cueing 25 - 49% of the time  Memory Memory: 3-Recognizes or recalls 50 - 74% of the time/requires cueing 25 - 49% of the time Medical Problem List and Plan:  1. 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 DVT Prophylaxis/Anticoagulation: Subcutaneous Lovenox 30 mg daily. Monitor platelet counts and any signs of bleeding  3. Pain Management: Ultram as needed. Monitor with increased mobility  4. Mood: Celexa 40 mg daily. Provide emotional support and positive reinforcement. SW to eval. Consider neuropsych eval as well.  5.. Neuropsych: This patient is capable of making decisions on her own behalf.   -baseline mild dementia likely 6.  Progressive weight loss. Cancer work up unremarkable. CEA cancelled.  Weight loss could be born out of depression related to loss of spouse.  7. Positive MRSA nasal nares. Contact precautions  8. COPD. Checking oxygen saturations every shift. Patient quit smoking 24 years ago  9. Chronic suppression of UTI. Continue Macrobid.  No incontinence at present.   LOS (Days) 8 A FACE TO FACE EVALUATION WAS PERFORMED  SWARTZ,ZACHARY T 11/03/2012 7:43 AM

## 2012-11-03 NOTE — Progress Notes (Signed)
Speech Language Pathology Session Note & Discharge Summary  Patient Details  Name: Nicole Sparks MRN: [id number] Date of Birth: April 14, 1932  Today's Date: 11/03/2012 Time: 4098-1191 Time Calculation (min): 30 min  Skilled Therapeutic Intervention: Treatment focus on pt education for anticipated discharge home tomorrow.  Pt educated on strategies to utilize at home to increase safety, problem solving and working [employer] fast streamer. Handouts were also given to reinforce information. Pt's daughter was also reached by phone to reinforce education. Pt's daughter verbalized understanding and also reported the pt is currently "better than baseline."    Patient has met 4 of 4 long term goals.  Patient to discharge at overall Supervision level.   Reasons goals not met: N/A   Clinical Impression/Discharge Summary: Pt has made functional gains and has met 4 of 4 LTG's this admission due to improvements in cognitive function. Currently, pt is demonstrating behaviors consistent with a Rancho Level VIII and requires overall supervision for selective attention, working memory, functional problem solving, safety awareness and anticipatory awareness. Pt/family education complete and pt will discharge home with 24 hour supervision. Skilled SLP intervention is not warranted at this time due pt is currently at her cognitive baseline (both the pt and the family confirm).   Care Partner:  Caregiver Able to Provide Assistance: Yes  Type of Caregiver Assistance: Cognitive  Recommendation:  None      Equipment: N/A   Reasons for discharge: Discharged from hospital;Treatment goals met   Patient/Family Agrees with Progress Made and Goals Achieved: Yes   See FIM for current functional status  Precious Gilchrest 11/03/2012, 2:52 PM

## 2012-11-03 NOTE — Progress Notes (Signed)
Occupational Therapy Session Note  Patient Details  Name: Nicole Sparks MRN: [id number] Date of Birth: 11-04-1931  Today's Date: 11/03/2012 Time: 1420-1520 Time Calculation (min): 60 min  Short Term Goals: Week 1:  OT Short Term Goal 1 (Week 1): STG=LTG  Skilled Therapeutic Interventions/Progress Updates:  Patient ambulated to and from therapy kitchen and engaged in removing all pots, pans, baking pans and cooking utensils from kitchen cabinets and drawer.  She assisted to inventory items the decide which items to discard and which items needed to be replaced.  Patient then wrote a list of items to purchase.  Patient was distant supervision with some of her dynamic balance with this advanced IADL task and required min cues to accurately complete the list for purchase of items.  Therapy Documentation Precautions:  Precautions Precautions: Fall Precaution Comments: perceptual difficulties  Restrictions Weight Bearing Restrictions: No Pain: Denies pain  Therapy/Group: Individual Therapy  Secret Kristensen 11/03/2012, 4:52 PM

## 2012-11-03 NOTE — Progress Notes (Signed)
Physical Therapy Discharge Summary  Patient Details  Name: Nicole Sparks MRN: [id number] Date of Birth: 1931-11-21  Today's Date: 11/03/2012 Time: 0730-0830 Total Time: 60 min   Ambulation x 200', 300', 200' with supervision controlled environment x 100' in home environment varied directions over carpeted and tile surfaces. Berg Balance test, stairs with supervision x 15. Car transfer with supervision for safety, no cues needed. Balance activities ball toss   Patient has met 7 of 7 long term goals due to improved activity tolerance, improved balance, improved postural control, ability to compensate for deficits, improved attention, improved awareness and improved coordination.  Patient to discharge at an ambulatory level Supervision.   Patient's care partner is independent to provide the necessary physical and cognitive assistance at discharge.  Reasons goals not met: NA  Recommendation:  Patient will benefit from ongoing skilled PT services in outpatient setting to continue to advance safe functional mobility, address ongoing impairments in safety awareness, dynamic balance, balance with cognitive distraction, higher level processing, and minimize fall risk.  Equipment: No equipment provided  Reasons for discharge: treatment goals met and discharge from hospital  Patient/family agrees with progress made and goals achieved: Yes  PT Discharge Precautions/Restrictions Precautions Precautions: Fall Precaution Comments: perceptual difficulties  Pain Pain Assessment Pain Assessment: No/denies pain  Cognition  Impaired, pt demonstrates decreased alternating attention and has decreased safety awareness. She has poor problem solving and slow processing at times.   Sensation Sensation Light Touch: Appears Intact Proprioception: Appears Intact Coordination Gross Motor Movements are Fluid and Coordinated: Yes Fine Motor Movements are Fluid and Coordinated: Yes  Mobility Bed  Mobility Bed Mobility: Supine to Sit;Sitting - Scoot to Edge of Bed;Sit to Supine Supine to Sit: 6: Modified independent (Device/Increase time) Sitting - Scoot to Edge of Bed: 5: Supervision Sit to Supine: 6: Modified independent (Device/Increase time) Transfers Sit to Stand: 4: Min assist;5: Supervision Stand to Sit: 5: Supervision Locomotion  Ambulation Ambulation: Yes Ambulation/Gait Assistance: 5: Supervision Ambulation Distance (Feet): 500 Feet Assistive device: None Ambulation/Gait Assistance Details: Slightly unsteady and due to some confusion she is a high risk for falls and needs supervision Gait Gait: Yes Gait Pattern: Impaired Gait Pattern: Step-through pattern;Shuffle (min foot clearance bilaterally) High Level Ambulation High Level Ambulation: Side stepping;Backwards walking;Direction changes Side Stepping: supervision Backwards Walking: supervision Stairs / Additional Locomotion Stairs: Yes Stairs Assistance: 5: Supervision Stairs Assistance Details (indicate cue type and reason): Pt able to verbalize need to go slow initially. Stair Management Technique: One rail Right Number of Stairs: 15 Wheelchair Mobility Wheelchair Mobility: No  Trunk/Postural Assessment  Cervical Assessment Cervical Assessment: Within Functional Limits Thoracic Assessment Thoracic Assessment:  (kyphosis ) Lumbar Assessment Lumbar Assessment:  (posterior pelvic tilit) Postural Control Postural Control: Deficits on evaluation Righting Reactions: delayed  Balance Standardized Balance Assessment Standardized Balance Assessment: Berg Balance Test Berg Balance Test Sit to Stand: Able to stand without using hands and stabilize independently Standing Unsupported: Able to stand safely 2 minutes Sitting with Back Unsupported but Feet Supported on Floor or Stool: Able to sit safely and securely 2 minutes Stand to Sit: Sits safely with minimal use of hands Transfers: Able to transfer safely,  minor use of hands Standing Unsupported with Eyes Closed: Able to stand 10 seconds with supervision Standing Ubsupported with Feet Together: Able to place feet together independently and stand for 1 minute with supervision From Standing, Reach Forward with Outstretched Arm: Can reach confidently >25 cm (10") From Standing Position, Pick up Object from Floor: Able to  pick up shoe safely and easily From Standing Position, Turn to Look Behind Over each Shoulder: Looks behind from both sides and weight shifts well Turn 360 Degrees: Able to turn 360 degrees safely one side only in 4 seconds or less Standing Unsupported, Alternately Place Feet on Step/Stool: Able to stand independently and complete 8 steps >20 seconds Standing Unsupported, One Foot in Front: Able to plae foot ahead of the other independently and hold 30 seconds Standing on One Leg: Tries to lift leg/unable to hold 3 seconds but remains standing independently Total Score: 48 Extremity Assessment      RLE Assessment RLE Assessment: Within Functional Limits LLE Assessment LLE Assessment: Within Functional Limits  See FIM for current functional status  Wilhemina Bonito 11/03/2012, 8:09 AM

## 2012-11-03 NOTE — Discharge Summary (Signed)
  Discharge summary job # [id number]

## 2012-11-03 NOTE — Discharge Summary (Signed)
NAMEJANYLA, Nicole Sparks NO.:  [id number]  MEDICAL RECORD NO.:  [id number]  LOCATION:  [id number]                        FACILITY:  [facility]  PHYSICIAN:  Ranelle Oyster, M.D.DATE OF BIRTH:  Jan 08, 1932  DATE OF ADMISSION:  10/26/2012 DATE OF DISCHARGE:  11/04/2012                              DISCHARGE SUMMARY   DISCHARGE DIAGNOSES: 1. Right parietal intraparenchymal hemorrhage, small subdural hematoma     likely traumatic. 2. Subcutaneous Lovenox for deep vein thrombosis prophylaxis and pain     management. 3. Depression. 4. Progressive weight loss. 5. Positive MRSA nasal nares. 6. Chronic obstructive pulmonary disease. 7. Chronic suppression of urinary tract infection.  HISTORY OF PRESENT ILLNESS:  This is a 79 year old right-handed female with COPD, urethral stenosis with revision urostomy in the past.  By report, patient is not doing well for the past 3 weeks with multiple falls, poor balance as well as weight loss over the last 5 months.  He presented on Oct 21, 2012, with dizziness and more difficulty with ambulation.  MRI of the brain showed a late subacute intraparenchymal hematoma.  The patient did not receive tPA.  Neurology Services consulted with full workup ongoing.  CT of the chest showed large hiatal hernia.  COPD with questionable 6 mm right apex nodule and tiny bilateral pleural effusions as well as old bilateral rib fractures.  CT abdomen and pelvis with left hydronephrosis and hydroureter, appears to be due to obstruction at the anastomosis with the urinary diversion question stricture.  Colonic diverticulosis without evidence of diverticulitis.  Duodenal thickening intentionally be an artifact from under distention but other etiologies included duodenitis inflammation or less likely tumor.  CEA was pending.  Underwent endoscopy on Oct 24, 2012, that was unremarkable.  Contact precautions for positive MRSA nasal nares.  Physical and occupational  therapy ongoing,  patient was admitted for comprehensive rehab program.  PAST MEDICAL HISTORY:  See discharge diagnoses.  SOCIAL HISTORY:  Lives with daughter.  FUNCTIONAL HISTORY:  Prior to admission, independent and retired. Functional status upon admission to rehab services was minimal assist to ambulate 125 feet, one-person handheld assistance.  PHYSICAL EXAMINATION:  VITAL SIGNS:  Blood pressure 138/65 pulse 68, temperature 98, respirations 19. GENERAL:  This was an alert female, oriented to person, place, and time. Occasionally needed some redirection regarding topic of conversation. HEENT:  Pupils were round and reactive to light. LUNGS:  Decreased breath sounds.  Clear to auscultation. CARDIAC:  Regular rate and rhythm. ABDOMEN:  Soft, nontender.  Good bowel sounds.  REHABILITATION HOSPITAL COURSE:  The patient was admitted to inpatient rehab services with therapies initiated on a 3-hour daily basis consisting of physical and occupational therapy, speech therapy, and 24- hour rehabilitation nursing.  The following issues were addressed during the patient's rehabilitation stay.  Pertaining to Ms. Cammack' right parietal intraparenchymal hemorrhage with small subdural hematoma, likely felt to be traumatic and remained stable as her mobility continued to improve.  She had been placed on subcutaneous Lovenox for DVT prophylaxis and monitoring for any bleeding episodes.  Pain management with the use of Ultram and good results.  She did have a history of depression.  She remained on Celexa with  emotional support provided.  She was cooperating well with staff.  Noted progressive weight loss.  Cancer workup unremarkable.  CEA was canceled.  It was felt of possible weight loss could be born out of depression related to the loss of her spouse.  Again, emotional support provided.  She remained on contact precautions for positive MRSA nasal nares.  She did have a history of COPD.   Oxygen saturations remained greater than 90% on room air.  Chronic suppression of UTI with Macrobid.  She denied any urinary frequency.  The patient received weekly collaborative interdisciplinary team conferences to discuss estimated length of stay, family teaching, and any barriers to discharge.  She has a urostomy, continent of bowel.  Minimal assist for activities of daily living. Minimal assist for mobility.  Strength and endurance did continue to improve.  Full family teaching was completed with her daughter, was recommended the need for close supervision for patient's safety in relation to falls.  Ongoing therapies would be dictated as per rehab services.  DISCHARGE MEDICATIONS:  Celexa 40 mg p.o. daily, multivitamin 1 tablet daily, Macrobid 100 mg p.o. daily, Ensure supplements 3 times daily, Pamelor 10 mg at bedtime, Protonix 40 mg p.o. daily, Senokot-S tablets 1 p.o. b.i.d. hold for loose stool, Ultram 50 mg every 4 hours as needed pain.  DIET:  Regular.  SPECIAL INSTRUCTIONS:  Patient would follow up with primary MD, Dr. Nicholos Johns, medical management; Dr. Delia Heady in 1 month, call for appointment; Dr.  Faith Rogue at the outpatient rehab service office as directed.     Nicole Sparks, P.A.   ______________________________ Ranelle Oyster, M.D.    DA/MEDQ  D:  11/03/2012  T:  11/03/2012  Job:  [id number]  cc:   Jordan Hawks. Elnoria Howard, MD Elana Alm. Nicholos Johns, M.D.

## 2012-11-03 NOTE — Patient Care Conference (Signed)
Inpatient RehabilitationTeam Conference and Plan of Care Update Date: 11/03/2012   Time: 2:55 PM    Patient Name: Nicole Sparks      Medical Record Number: [id number]  Date of Birth: Mar 04, 1932 Sex: Female         Room/Bed: 4029/4029-01 Payor Info: Payor: MEDICARE / Plan: MEDICARE PART A AND B / Product Type: *No Product type* /    Admitting Diagnosis: sick  Admit Date/Time:  10/26/2012  2:58 PM Admission Comments: No comment available   Primary Diagnosis:  Intraparenchymal hemorrhage of brain Principal Problem: Intraparenchymal hemorrhage of brain  Patient Active Problem List   Diagnosis Date Noted  . Intraparenchymal hemorrhage of brain 10/27/2012  . Nonspecific (abnormal) findings on radiological and other examination of gastrointestinal tract 10/24/2012  . Reflux esophagitis 10/24/2012  . Weight loss, non-intentional 10/23/2012  . Duodenum disorder 10/23/2012  . Iron deficiency anemia 10/23/2012  . Traumatic cerebral intraparenchymal hematoma 10/22/2012  . Hyponatremia 10/22/2012  . Restless leg 10/22/2012  . Urethral stenosis 10/22/2012  . Recurrent UTI 10/22/2012  . Metabolic encephalopathy 10/22/2012  . Recurrent falls 10/22/2012    Expected Discharge Date: Expected Discharge Date: 11/04/12  Team Members Present: Physician leading conference: Dr. Faith Rogue Social Worker Present: Amada Jupiter, LCSW Nurse Present: Carmie End, RN PT Present: Reggy Eye, PT OT Present: Mackie Pai, Marye Round, OT SLP Present: Feliberto Gottron, SLP Other (Discipline and Name): Tora Duck, PPS Coordinator;  Tera Partridge, RN     Current Status/Progress Goal Weekly Team Focus  Medical   improving cognition to baseline  see prior  stabilize medically for dc   Bowel/Bladder   Urostomy-patient preforms self care; continent of bowel LBM-6/2  Remain continent of bowel; continue to self care for urostomy  Continue to monitor   Swallow/Nutrition/ Hydration              ADL's   supervision to mod I     d/c planning, left inattention   Mobility   supervision  supervision  d/c planning   Communication             Safety/Cognition/ Behavioral Observations  supervision  Supervision  D/C home tomorrow. Education complete   Pain   c/o back pain controlled with Tramadol   pain level 3  continue to assess q shift and prn   Skin   skin intact  Skin will remain intact  Continue to assess, and monitor    Rehab Goals Patient on target to meet rehab goals: Yes *See Care Plan and progress notes for long and short-term goals.  Barriers to Discharge: see prior    Possible Resolutions to Barriers:  see prior    Discharge Planning/Teaching Needs:  home with daughter to provide 24/7 assistance/ supervision      Team Discussion:  Ready for d/c tomorrow.  Good gains, however, cognition still somewhat impaired.  Has her own opinions about safety.  Recommend OPPT for continued work on gait/balance.  Revisions to Treatment Plan:  None   Continued Need for Acute Rehabilitation Level of Care: The patient requires daily medical management by a physician with specialized training in physical medicine and rehabilitation for the following conditions: Daily direction of a multidisciplinary physical rehabilitation program to ensure safe treatment while eliciting the highest outcome that is of practical value to the patient.: Yes Daily medical management of patient stability for increased activity during participation in an intensive rehabilitation regime.: Yes Daily analysis of laboratory values and/or radiology reports with any subsequent need for  medication adjustment of medical intervention for : Neurological problems;Other  Nicole Sparks 11/03/2012, 5:01 PM

## 2012-11-03 NOTE — Progress Notes (Signed)
NUTRITION FOLLOW UP  DOCUMENTATION CODES  Per approved criteria   -Non-severe (moderate) malnutrition in the context of chronic illness    Intervention:   Continue supplements with medications and liberalized diet.  Nutrition Dx:   Malnutrition related to inadequate intake as evidenced by 20% weight loss in one year and mild loss of muscle mass.   Goal:   Intake to meet >90% of estimated nutrition needs.  Monitor:   PO intake, labs, weight trend.  Assessment:   Patient was brought in due to multiple falls related to poor balance for the past 3 weeks. Was diagnosed with UTI 3 weeks ago and has had poor balance since then. CT of abdomen and pelvis with L hydronephrosis. Duodenal thickening could potentially be an artifact from under distention but other etiologies included duodenitis inflammation or less likely tumor. Underwent endoscopy 5/24 was unremarkable. Speech therapy evaluation completed 10/22/2012 noting mild to moderate cognitive deficits which are worsened from baseline.   Planning to d/c tomorrow. Eating on average >50% of Regular meals. Ordered for Ensure Complete with medications, pt will take, sometimes not with meds but between meals per her request.  Per MD, pt noted to have progressive wt loss and cancer work-up is unremarkable. MD suspects wt loss possibly related to loss of spouse.  Height: Ht Readings from Last 1 Encounters:  10/26/12 5\' 2"  (1.575 m)    Weight Status:   Wt Readings from Last 1 Encounters:  10/28/12 125 lb 14.1 oz (57.1 kg)  Stable weight.  Re-estimated needs:  Kcal: 1350 - 1550 Protein: 70 - 80 g Fluid: 1.4 - 1.6 liters  Skin: intact  Diet Order: General   Intake/Output Summary (Last 24 hours) at 11/03/12 1219 Last data filed at 11/03/12 0800  Gross per 24 hour  Intake    840 ml  Output    775 ml  Net     65 ml    Last BM: 6/3   Labs:   Recent Labs Lab 11/02/12 0535  CREATININE 1.06    CBG (last 3)  No results found  for this basename: GLUCAP,  in the last 72 hours  Scheduled Meds: . citalopram  40 mg Oral Daily  . enoxaparin (LOVENOX) injection  30 mg Subcutaneous Q24H  . feeding supplement  120 mL Oral TID WC & HS  . multivitamin with minerals  1 tablet Oral Daily  . nitrofurantoin (macrocrystal-monohydrate)  100 mg Oral Daily  . nortriptyline  10 mg Oral QHS  . pantoprazole  40 mg Oral Q1200  . senna-docusate  1 tablet Oral BID    Continuous Infusions:   Jarold Motto MS, RD, LDN Pager: [phone] After-hours pager: [phone]

## 2012-11-03 NOTE — Progress Notes (Signed)
Occupational Therapy Daily notes and  Discharge Summary  Patient Details  Name: Nicole Sparks MRN: [id number] Date of Birth: 1931-08-28  Today's Date: 11/03/2012 Time: 1300-1400 Time Calculation (min): 60 min  1:1 GRAD DAY! Self care retraining with focus on decreased reliance of external verbal cues to go through established morning routine. Pt was able to find all items in her room and perform bathing, dressing and grooming at mod I level. Pt declined to shower today and wanted to wait until she got home but is able to safely enter and exit the shower. Pt still demonstrates left inattention; aligning herself to the right of the sink to use the sink or the mirror instead of the middle of the sink. Went out side to focus on activity tolerance and functional ambulation on uneven surfaces and different types of surface and path finding with questioning cues.  1:1 13:00-14:00 Self care retraining and cognition: focus on thought organization and planning. Pt set a picnic table for 4 people from a basket of needed and unneeded items. Pt able to complete within a timely manner. Engaged in cognition activity with headbands focusing on generating thoughts, making items fit into a category and then generating 2 additional items that would fit into the category. Pt then requested to go outside for a few min. With long distance ambulation pt slow and in crowded hallway needs encouragement to continue thorough the hallway.    Patient has met 12 of 12 long term goals due to improved activity tolerance, improved balance, postural control, ability to compensate for deficits, improved attention, improved awareness and improved coordination.  Patient to discharge at overall modified I in a  Supervision environment; she needs someone to be with her during the day due to decreased memory and perceptual deficits but is able to toilet, transfer, dress and bath at mod I level.  Patient's daughter is working out 24 hr  supervision for pt at home  Reasons goals not met: n/a  Recommendation:  No follow up OT at this time    Equipment: shower chair recommended   Reasons for discharge: treatment goals met and discharge from hospital  Patient/family agrees with progress made and goals achieved: Yes  OT Discharge Precautions/Restrictions  Precautions Precaution Comments: perceptual difficulties  Restrictions Weight Bearing Restrictions: No Pain  no pain in either session  ADL  see FIM Vision/Perception  Vision - History Baseline Vision: No visual deficits Patient Visual Report: Overshooting Vision - Assessment Ocular Range of Motion: Within Functional Limits Alignment/Gaze Preference:  (improved attention to left) Tracking/Visual Pursuits:  (improved smoothness of tracking) Saccades:  (improved speed) Visual Fields: Left visual field deficit Perception Inattention/Neglect:  (inattention to left; but has improved) Spatial Orientation: improved Praxis Praxis: Intact  Cognition Overall Cognitive Status: History of cognitive impairments - at baseline Arousal/Alertness: Awake/alert Orientation Level: Oriented X4 Attention: Selective Focused Attention: Appears intact Sustained Attention: Appears intact Selective Attention: Impaired Selective Attention Impairment: Functional basic;Verbal basic Memory: Impaired Memory Impairment: Storage deficit;Decreased recall of new information;Decreased short term memory Decreased Short Term Memory: Verbal basic;Functional basic Awareness: Impaired Awareness Impairment: Anticipatory impairment Problem Solving: Impaired Problem Solving Impairment: Functional basic Executive Function: Organizing;Reasoning;Sequencing Reasoning: Impaired Reasoning Impairment: Functional basic Sequencing: Impaired Sequencing Impairment: Functional basic Organizing: Impaired Organizing Impairment: Functional basic Safety/Judgment: Impaired [place] Scales of  Cognitive Functioning: Purposeful/appropriate Sensation Sensation Light Touch: Appears Intact Proprioception: Appears Intact Coordination Gross Motor Movements are Fluid and Coordinated: Yes Fine Motor Movements are Fluid and Coordinated: Yes Motor  Motor  Motor: Within Functional Limits Mobility  Bed Mobility Supine to Sit: 6: Modified independent (Device/Increase time) Transfers Sit to Stand: 6: Modified independent (Device/Increase time) Stand to Sit: 6: Modified independent (Device/Increase time)  Trunk/Postural Assessment  Cervical Assessment Cervical Assessment: Within Functional Limits Thoracic Assessment Thoracic Assessment:  (kyphosis) Lumbar Assessment Lumbar Assessment:  (posterior pelvic tilt) Postural Control Righting Reactions: present but delayed  Balance Static Standing Balance Static Standing - Level of Assistance: 6: Modified independent (Device/Increase time) Dynamic Standing Balance Dynamic Standing - Level of Assistance: 6: Modified independent (Device/Increase time) Extremity/Trunk Assessment RUE Assessment RUE Assessment: Within Functional Limits LUE Assessment LUE Assessment: Within Functional Limits  See FIM for current functional status  R[facility] 11/03/2012, 3:29 PM

## 2012-11-04 DIAGNOSIS — F329 Major depressive disorder, single episode, unspecified: Secondary | ICD-10-CM

## 2012-11-04 DIAGNOSIS — I619 Nontraumatic intracerebral hemorrhage, unspecified: Secondary | ICD-10-CM

## 2012-11-04 MED ORDER — NITROFURANTOIN MONOHYD MACRO 100 MG PO CAPS: 100.0000 mg | ORAL_CAPSULE | Freq: Every day | ORAL | Status: AC

## 2012-11-04 MED ORDER — OMEPRAZOLE 20 MG PO CPDR: 20.0000 mg | DELAYED_RELEASE_CAPSULE | Freq: Every day | ORAL | Status: AC | PRN

## 2012-11-04 MED ORDER — CITALOPRAM HYDROBROMIDE 40 MG PO TABS: 40.0000 mg | ORAL_TABLET | Freq: Every day | ORAL | Status: AC

## 2012-11-04 MED ORDER — NORTRIPTYLINE HCL 10 MG PO CAPS: 10.0000 mg | ORAL_CAPSULE | Freq: Every day | ORAL | Status: DC

## 2012-11-04 MED ORDER — TRAMADOL HCL 50 MG PO TABS: 50.0000 mg | ORAL_TABLET | ORAL | Status: AC | PRN

## 2012-11-04 NOTE — Plan of Care (Signed)
Problem: RH BLADDER ELIMINATION Goal: RH STG MANAGE BLADDER WITH EQUIPMENT WITH ASSISTANCE STG Manage Bladder With Equipment With min Assistance  Outcome: Completed/Met Date Met:  11/04/12 Independent with urostomy care

## 2012-11-04 NOTE — Progress Notes (Signed)
Social Work  Discharge Note  The overall goal for the admission was met for:   Discharge location: Yes - home with daughter who has arranged 24/7 supervision  Length of Stay: Yes - 10 days  Discharge activity level: Yes - supervision  Home/community participation: Yes  Services provided included: MD, RD, PT, OT, SLP, RN, TR, Pharmacy and SW  Financial Services: Medicare  Follow-up services arranged: Home Health: PT via [facility]  Comments (or additional information):  Patient/Family verbalized understanding of follow-up arrangements: Yes  Individual responsible for coordination of the follow-up plan: daughter  Confirmed correct DME delivered: NA  Shann Lewellyn

## 2012-11-04 NOTE — Progress Notes (Signed)
Pt discharged at 1030 with daughter to home. Discharge instructions by PA, Harvel Ricks given to pt and family with verbal understanding. All belongingss with pt.

## 2012-11-04 NOTE — Progress Notes (Signed)
Subjective/Complaints: Up and alert. "when can i drive?" A 12 point review of systems has been performed and if not noted above is otherwise negative.   Objective: Vital Signs: Blood pressure 145/81, pulse 65, temperature 97.7 F (36.5 C), temperature source Oral, resp. rate 18, height 5\' 2"  (1.575 m), weight 57.1 kg (125 lb 14.1 oz), SpO2 98.00%. No results found. No results found for this basename: WBC, HGB, HCT, PLT,  in the last 72 hours  Recent Labs  11/02/12 0535  CREATININE 1.06   CBG (last 3)  No results found for this basename: GLUCAP,  in the last 72 hours  Wt Readings from Last 3 Encounters:  10/28/12 57.1 kg (125 lb 14.1 oz)  10/21/12 53.7 kg (118 lb 6.2 oz)  10/21/12 53.7 kg (118 lb 6.2 oz)    Physical Exam:  Constitutional: She is oriented to person, place, and time. She appears well-developed and well-nourished. No distress.  HENT:  Head: Normocephalic and atraumatic.  Right Ear: External ear normal.  Left Ear: External ear normal.  Eyes: Conjunctivae and EOM are normal. Pupils are equal, round, and reactive to light. Right eye exhibits no discharge. Left eye exhibits no discharge.  Neck: Normal range of motion. Neck supple. No JVD present. No tracheal deviation present. No thyromegaly present.  Cardiovascular: Normal rate and regular rhythm. Exam reveals no friction rub. No gallops  No murmur heard.  Pulmonary/Chest: Effort normal and breath sounds normal. No respiratory distress. She has no wheezes. She has no rales. She exhibits no tenderness.  Abdominal: Soft. Bowel sounds are normal. She exhibits no distension. There is no tenderness. There is no rebound.  Musculoskeletal: She exhibits no edema.  Lymphadenopathy:  She has no cervical adenopathy.  Neurological: She is alert and oriented to person, place, and time. Follows all simple commands Mild left sided weakness( 4+). Left PN and decreased FMC on the left side.---  Sensation grossly intact. No CN  findings. Cognitively has basic insight and awareness .-  CN unremarkable.  Skin: Skin is dry.     Assessment/Plan: 1. Functional deficits secondary to Right parietal IPH, small traumatic SDH (right para falcine area) which require 3+ hours per day of interdisciplinary therapy in a comprehensive inpatient rehab setting. Physiatrist is providing close team supervision and 24 hour management of active medical problems listed below. Physiatrist and rehab team continue to assess barriers to discharge/monitor patient progress toward functional and medical goals.  Home today with supervision. Reviewed safety and driving with pt today.   FIM: FIM - Bathing Bathing Steps Patient Completed: Chest;Right Arm;Left Arm;Abdomen;Left upper leg;Right upper leg;Buttocks;Front perineal area;Right lower leg (including foot);Left lower leg (including foot) Bathing: 5: Supervision: Safety issues/verbal cues  FIM - Upper Body Dressing/Undressing Upper body dressing/undressing steps patient completed: Thread/unthread right bra strap;Thread/unthread left bra strap;Hook/unhook bra;Thread/unthread right sleeve of pullover shirt/dresss;Thread/unthread left sleeve of pullover shirt/dress;Put head through opening of pull over shirt/dress;Pull shirt over trunk Upper body dressing/undressing: 6: More than reasonable amount of time FIM - Lower Body Dressing/Undressing Lower body dressing/undressing steps patient completed: Thread/unthread right underwear leg;Thread/unthread left underwear leg;Pull underwear up/down;Thread/unthread right pants leg;Thread/unthread left pants leg;Pull pants up/down;Fasten/unfasten pants;Don/Doff right sock;Don/Doff left sock;Don/Doff right shoe;Don/Doff left shoe;Fasten/unfasten right shoe;Fasten/unfasten left shoe Lower body dressing/undressing: 6: More than reasonable amount of time  FIM - Toileting Toileting steps completed by patient: Adjust clothing prior to toileting;Performs perineal  hygiene;Adjust clothing after toileting Toileting: 6: Assistive device: No helper  FIM - Diplomatic Services operational officer Devices: Grab bars  Toilet Transfers: 6-More than reasonable amt of time  FIM - Banker Devices: Bed rails Bed/Chair Transfer: 6: More than reasonable amt of time  FIM - Locomotion: Wheelchair Locomotion: Wheelchair: 0: Activity did not occur (ambulation primary means of locomotion) FIM - Locomotion: Ambulation Locomotion: Ambulation Assistive Devices:  (no AD) Ambulation/Gait Assistance: 5: Supervision Locomotion: Ambulation: 5: Travels 150 ft or more with supervision/safety issues  Comprehension Comprehension Mode: Auditory Comprehension: 5-Understands basic 90% of the time/requires cueing < 10% of the time  Expression Expression Mode: Verbal Expression: 5-Expresses basic needs/ideas: With no assist  Social Interaction Social Interaction: 5-Interacts appropriately 90% of the time - Needs monitoring or encouragement for participation or interaction.  Problem Solving Problem Solving: 5-Solves basic 90% of the time/requires cueing < 10% of the time  Memory Memory: 5-Recognizes or recalls 90% of the time/requires cueing < 10% of the time Medical Problem List and Plan:  1. 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 DVT Prophylaxis/Anticoagulation: Subcutaneous Lovenox 30 mg daily. Monitor platelet counts and any signs of bleeding  3. Pain Management: Ultram as needed. Monitor with increased mobility  4. Mood: Celexa 40 mg daily. Provide emotional support and positive reinforcement. SW to eval. Consider neuropsych eval as well.  5.. Neuropsych: This patient is capable of making decisions on her own behalf.   -baseline mild dementia likely 6. Progressive weight loss. Cancer work up unremarkable. CEA cancelled.  Weight loss could be born out of depression related to  loss of spouse.   -follow up with PCP as an outpt 7. Positive MRSA nasal nares. Contact precautions  8. COPD. Checking oxygen saturations every shift. Patient quit smoking 24 years ago  9. Chronic suppression of UTI. Continue Macrobid.  No incontinence at present.   LOS (Days) 9 A FACE TO FACE EVALUATION WAS PERFORMED  SWARTZ,ZACHARY T 11/04/2012 7:41 AM

## 2012-11-24 ENCOUNTER — Encounter: Payer: Medicare Other | Attending: Physical Medicine & Rehabilitation | Admitting: Physical Medicine & Rehabilitation

## 2012-11-24 ENCOUNTER — Encounter: Payer: Self-pay | Admitting: Physical Medicine & Rehabilitation

## 2012-11-24 VITALS — BP 152/96 | HR 101 | Resp 16 | Ht 62.0 in | Wt 116.4 lb

## 2012-11-24 DIAGNOSIS — R279 Unspecified lack of coordination: Secondary | ICD-10-CM | POA: Insufficient documentation

## 2012-11-24 DIAGNOSIS — Z5189 Encounter for other specified aftercare: Secondary | ICD-10-CM

## 2012-11-24 DIAGNOSIS — X58XXXA Exposure to other specified factors, initial encounter: Secondary | ICD-10-CM | POA: Insufficient documentation

## 2012-11-24 DIAGNOSIS — I619 Nontraumatic intracerebral hemorrhage, unspecified: Secondary | ICD-10-CM

## 2012-11-24 DIAGNOSIS — IMO0002 Reserved for concepts with insufficient information to code with codable children: Secondary | ICD-10-CM | POA: Insufficient documentation

## 2012-11-24 DIAGNOSIS — S065XAA Traumatic subdural hemorrhage with loss of consciousness status unknown, initial encounter: Secondary | ICD-10-CM | POA: Insufficient documentation

## 2012-11-24 DIAGNOSIS — R41840 Attention and concentration deficit: Secondary | ICD-10-CM | POA: Insufficient documentation

## 2012-11-24 DIAGNOSIS — S065X9A Traumatic subdural hemorrhage with loss of consciousness of unspecified duration, initial encounter: Secondary | ICD-10-CM | POA: Insufficient documentation

## 2012-11-24 DIAGNOSIS — S06350D Traumatic hemorrhage of left cerebrum without loss of consciousness, subsequent encounter: Secondary | ICD-10-CM

## 2012-11-24 NOTE — Progress Notes (Signed)
Subjective:    Patient ID: Nicole Sparks, female    DOB: 11-25-1931, 79 y.o.   MRN: [id number]  HPI   Mrs. Lycan is back regarding her TBI. She has progressed very nicely with HHPT. She is walking without a cane. She has not had a fall. Her balance has been good.   She wants to return to driving. She wants to drive in her neighborhood area to her local hair dresser. Her daughter is here with her today and has concerns over her safety due to impulsivity and occasional confusion. She is worried that her reaction time may not be good enough to drive. Daughter has not noticed any major safety concerns since she's been home.   She denies substantial pain. Her mood has been positive. Appetite has been good.   Pain Inventory Average Pain 2 Pain Right Now 0 My pain is aching  In the last 24 hours, has pain interfered with the following? General activity na Relation with others na Enjoyment of life na What TIME of day is your pain at its worst? night Sleep (in general) Good  Pain is worse with: na Pain improves with: medication Relief from Meds: 9  Mobility ability to climb steps?  yes do you drive?  no  Function retired  Neuro/Psych No problems in this area  Prior Studies Any changes since last visit?  no  Physicians involved in your care Any changes since last visit?  no   History reviewed. No pertinent family history. History   Social History  . Marital Status: Widowed    Spouse Name: N/A    Number of Children: N/A  . Years of Education: N/A   Social History Main Topics  . Smoking status: Former Smoker    Quit date: 10/21/1988  . Smokeless tobacco: Never Used  . Alcohol Use: 0.0 oz/week    0 Glasses of wine per week     Comment: daily  . Drug Use: No  . Sexually Active: None   Other Topics Concern  . None   Social History Narrative  . None   Past Surgical History  Procedure Laterality Date  . Revision urostomy cutaneous    . Bladder surgery    .  Abdominal hysterectomy    . Esophagogastroduodenoscopy N/A 10/24/2012    Procedure: ESOPHAGOGASTRODUODENOSCOPY (EGD);  Surgeon: Hart Carwin, MD;  Location: [facility] ENDOSCOPY;  Service: Endoscopy;  Laterality: N/A;   Past Medical History  Diagnosis Date  . COPD (chronic obstructive pulmonary disease)   . Asthma   . Arthritis   . Renal disorder   . Renal insufficiency    BP 152/96  Pulse 101  Resp 16  Ht 5\' 2"  (1.575 m)  Wt 116 lb 6.4 oz (52.799 kg)  BMI 21.28 kg/m2  SpO2 97%     Review of Systems  Constitutional: Positive for unexpected weight change.  All other systems reviewed and are negative.       Objective:   Physical Exam   General: Alert and oriented x 3, No apparent distress HEENT: Head is normocephalic, atraumatic, PERRLA, EOMI, sclera anicteric, oral mucosa pink and moist, dentition intact, ext ear canals clear,  Neck: Supple without JVD or lymphadenopathy Heart: Reg rate and rhythm. No murmurs rubs or gallops Chest: CTA bilaterally without wheezes, rales, or rhonchi; no distress Abdomen: Soft, non-tender, non-distended, bowel sounds positive. Extremities: No clubbing, cyanosis, or edema. Pulses are 2+ Skin: Clean and intact without signs of breakdown Neuro: A x O x 3. She  got the month wrong when i asked her for the date "7 days from today" she remembered 2/3 words after 5 minutes. She was able to sequence the word "world" forward and backwards. She sometimes would become flustered within a certain task. She was occasionally impulsive. She tended to drift to the right with gait and sometimes dragged the foot. She could not walk heel to toe. Pronator drift negative. Romberg negative. .  Musculoskeletal: Full ROM, No pain with AROM or PROM in the neck, trunk, or extremities. Posture appropriate Psych: Pt's affect is appropriate. Pt is cooperative         Assessment & Plan:  1. Functional deficits secondary to Right parietal IPH, small traumatic SDH (right para  falcine area)   -improved from a cognitive and balance standpoint.  -she is still having difficulties with higher level balance.  -she also struggles a bit with attention and focus at higher levels. She is afraid to show any "flaws" to her family or doctors as she doesn't want to concede any of the "liberties" she is accustomed to.  -It appears that she is somewhat over the passing of her husband although I believe that this still remains an issue   Plan:  1. I gave the patient and daughter the following instructions as they pertain to her driving: YOU MAY TRY DRIVING IN AN EMPTY PARKING LOT WITH FAMILY AS A TRIAL. IF YOU ARE ABLE TO ACCOMPLISH THIS SUCCESSFULLY ON TWO SEPARATE ATTEMPTS, THEN YOU MAY TRY DRIVING IN YOUR NEIGHBORHOOD ON AN EARLY WEEKEND MORNING WITH FAMILY.   IF THE NEIGHBORHOOD DRIVING GOES WELL ON TWO SEPARATE ATTEMPTS THEN YOU MAY TRY DRIVING IN LOW LEVEL TRAFFIC WITH SUPERVISION.   IF THE DRIVING IN TRAFFIC GOES WITHOUT ANY PROBLEMS THROUGH 3 ATTEMPTS AND THERE ARE NO SAFETY CONCERNS, THEN YOU MAY DRIVE TO THE HAIRDRESSER, [facility]UR[facility], POST-OFFICE, OR STORE DURING THE DAYTIME ONLY. YOUR DRIVING RADIUS MAY BE NO LONGER THAN 15 MINUTES.  If there is still a concern after the traffic trials with her daughter, we will need to pursue formal driving evaluation  2. I would like for her to complete her physical therapy as outlined by [facility] PT. It also may help her to do some home exercising in the pool---however, the daughter and the patient tell me that she has bit of an aversion to the pool!  Either way, she needs to work on some exercises to maintain her balance and physical fitness/stamina  3. I will see her back prn. 30 minutes of face to face patient care time were spent during this visit. All questions were encouraged and answered.

## 2012-11-24 NOTE — Patient Instructions (Addendum)
DRIVING:  YOU MAY TRY DRIVING IN AN EMPTY PARKING LOT WITH FAMILY AS A TRIAL. IF YOU ARE ABLE TO ACCOMPLISH THIS SUCCESSFULLY ON TWO SEPARATE ATTEMPTS, THEN YOU MAY TRY DRIVING IN YOUR NEIGHBORHOOD ON AN EARLY WEEKEND MORNING WITH FAMILY.   IF THE NEIGHBORHOOD DRIVING GOES WELL ON TWO SEPARATE ATTEMPTS THEN YOU MAY TRY DRIVING IN LOW LEVEL TRAFFIC WITH SUPERVISION.   IF THE DRIVING IN TRAFFIC GOES WITHOUT ANY PROBLEMS THROUGH 3 ATTEMPTS AND THERE ARE NO SAFETY CONCERNS, THEN YOU MAY DRIVE TO THE HAIRDRESSER, [facility], POST-OFFICE, OR STORE DURING THE DAYTIME ONLY. YOUR DRIVING RADIUS MAY BE NO LONGER THAN 15 MINUTES.

## 2012-12-24 ENCOUNTER — Other Ambulatory Visit: Payer: Self-pay

## 2012-12-24 MED ORDER — NORTRIPTYLINE HCL 10 MG PO CAPS: 10.0000 mg | ORAL_CAPSULE | Freq: Every day | ORAL | Status: DC

## 2013-01-06 ENCOUNTER — Other Ambulatory Visit: Payer: Self-pay

## 2013-01-31 ENCOUNTER — Encounter (HOSPITAL_COMMUNITY): Payer: Self-pay | Admitting: Emergency Medicine

## 2013-01-31 ENCOUNTER — Emergency Department (INDEPENDENT_AMBULATORY_CARE_PROVIDER_SITE_OTHER)
Admission: EM | Admit: 2013-01-31 | Discharge: 2013-01-31 | Disposition: A | Discharge status: Nursing Home (Includes ICF) | Payer: Medicare Other | Source: Home / Self Care | Attending: Emergency Medicine | Admitting: Emergency Medicine

## 2013-01-31 ENCOUNTER — Emergency Department (HOSPITAL_COMMUNITY)
Admission: EM | Admit: 2013-01-31 | Discharge: 2013-01-31 | Disposition: A | Discharge status: Home / Self Care | Payer: Medicare Other | Attending: Emergency Medicine | Admitting: Emergency Medicine

## 2013-01-31 ENCOUNTER — Emergency Department (HOSPITAL_COMMUNITY): Payer: Medicare Other

## 2013-01-31 DIAGNOSIS — S0101XA Laceration without foreign body of scalp, initial encounter: Secondary | ICD-10-CM

## 2013-01-31 DIAGNOSIS — S0990XA Unspecified injury of head, initial encounter: Secondary | ICD-10-CM

## 2013-01-31 DIAGNOSIS — S51812A Laceration without foreign body of left forearm, initial encounter: Secondary | ICD-10-CM

## 2013-01-31 DIAGNOSIS — W1809XA Striking against other object with subsequent fall, initial encounter: Secondary | ICD-10-CM | POA: Insufficient documentation

## 2013-01-31 DIAGNOSIS — J4489 Other specified chronic obstructive pulmonary disease: Secondary | ICD-10-CM | POA: Insufficient documentation

## 2013-01-31 DIAGNOSIS — Z87891 Personal history of nicotine dependence: Secondary | ICD-10-CM | POA: Insufficient documentation

## 2013-01-31 DIAGNOSIS — Z79899 Other long term (current) drug therapy: Secondary | ICD-10-CM | POA: Insufficient documentation

## 2013-01-31 DIAGNOSIS — Z87448 Personal history of other diseases of urinary system: Secondary | ICD-10-CM | POA: Insufficient documentation

## 2013-01-31 DIAGNOSIS — S51809A Unspecified open wound of unspecified forearm, initial encounter: Secondary | ICD-10-CM

## 2013-01-31 DIAGNOSIS — S0100XA Unspecified open wound of scalp, initial encounter: Secondary | ICD-10-CM

## 2013-01-31 DIAGNOSIS — Z8739 Personal history of other diseases of the musculoskeletal system and connective tissue: Secondary | ICD-10-CM | POA: Insufficient documentation

## 2013-01-31 DIAGNOSIS — Y9389 Activity, other specified: Secondary | ICD-10-CM | POA: Insufficient documentation

## 2013-01-31 DIAGNOSIS — Y929 Unspecified place or not applicable: Secondary | ICD-10-CM | POA: Insufficient documentation

## 2013-01-31 DIAGNOSIS — J449 Chronic obstructive pulmonary disease, unspecified: Secondary | ICD-10-CM | POA: Insufficient documentation

## 2013-01-31 NOTE — ED Notes (Signed)
Patient presents with head laceration; patient tripped and fell trying to get ball for her dog today. States headache; denies loss of consciousness.

## 2013-01-31 NOTE — ED Notes (Signed)
Patient to be transferred to [facility] ED via shuttle; Patient to have CT of head per Dr. Lorenz Coaster.

## 2013-01-31 NOTE — ED Provider Notes (Signed)
CSN: [id number]     Arrival date & time 01/31/13  1718 History   First MD Initiated Contact with Patient 01/31/13 1732     Chief Complaint  Patient presents with  . Fall  . Head Injury   (Consider location/radiation/quality/duration/timing/severity/associated sxs/prior Treatment) HPI Comments: Patient presents to the emergency department with chief complaint of fall. She states that she was bending over to get a ball for her dog, when she lost balance and fell and hit her head. She denies any loss of consciousness. Denies any double vision, blurred vision, or headache. Denies any numbness, weakness, or motor deficits. She is sent to the ED by urgent care, for evaluation by head CT. She sustained a large laceration on her left parietal scalp, which was repaired with staples by the urgent care. Patient states that her head feels fuzzy, but she is otherwise in no apparent distress.  The history is provided by the patient. No language interpreter was used.    Past Medical History  Diagnosis Date  . COPD (chronic obstructive pulmonary disease)   . Asthma   . Arthritis   . Renal disorder   . Renal insufficiency    Past Surgical History  Procedure Laterality Date  . Revision urostomy cutaneous    . Bladder surgery    . Abdominal hysterectomy    . Esophagogastroduodenoscopy N/A 10/24/2012    Procedure: ESOPHAGOGASTRODUODENOSCOPY (EGD);  Surgeon: Hart Carwin, MD;  Location: [facility] ENDOSCOPY;  Service: Endoscopy;  Laterality: N/A;   No family history on file. History  Substance Use Topics  . Smoking status: Former Smoker    Quit date: 10/21/1988  . Smokeless tobacco: Never Used  . Alcohol Use: 0.0 oz/week    0 Glasses of wine per week     Comment: daily   OB History   Grav Para Term Preterm Abortions TAB SAB Ect Mult Living                 Review of Systems  All other systems reviewed and are negative.    Allergies  Sudafed  Home Medications   Current Outpatient Rx  Name   Route  Sig  Dispense  Refill  . budesonide-formoterol (SYMBICORT) 80-4.5 MCG/ACT inhaler   Inhalation   Inhale 2 puffs into the lungs 2 (two) times daily as needed (for shortnes of breath).         . calcium-vitamin D (OSCAL WITH D) 500-200 MG-UNIT per tablet   Oral   Take 1 tablet by mouth 2 (two) times daily.         . cetirizine (ZYRTEC) 10 MG tablet   Oral   Take 10 mg by mouth daily as needed for allergies.         . citalopram (CELEXA) 40 MG tablet   Oral   Take 1 tablet (40 mg total) by mouth daily.   30 tablet   1   . Menthol, Topical Analgesic, (BENGAY EX)   Topical   Apply 1 application topically at bedtime as needed (for knee/back pain).         . Multiple Vitamin (MULTIVITAMIN WITH MINERALS) TABS   Oral   Take 1 tablet by mouth daily.         . nitrofurantoin, macrocrystal-monohydrate, (MACROBID) 100 MG capsule   Oral   Take 1 capsule (100 mg total) by mouth daily.   30 capsule   1   . nortriptyline (PAMELOR) 10 MG capsule   Oral   Take 1  capsule (10 mg total) by mouth at bedtime.   30 capsule   6   . omeprazole (PRILOSEC) 20 MG capsule   Oral   Take 1 capsule (20 mg total) by mouth daily as needed (for heartburn).   30 capsule   1   . traMADol (ULTRAM) 50 MG tablet   Oral   Take 1 tablet (50 mg total) by mouth every 4 (four) hours as needed for pain.   60 tablet   0    BP 133/77  Pulse 88  Temp(Src) 97 F (36.1 C) (Oral)  Resp 18  Ht 5\' 3"  (1.6 m)  Wt 120 lb (54.432 kg)  BMI 21.26 kg/m2  SpO2 100% Physical Exam  Nursing note and vitals reviewed. Constitutional: She is oriented to person, place, and time. She appears well-developed and well-nourished.  HENT:  Head: Normocephalic and atraumatic.  Right Ear: External ear normal.  Left Ear: External ear normal.  5 cm laceration to the left parietal area, repaired with staples  Eyes: Conjunctivae and EOM are normal. Pupils are equal, round, and reactive to light.  No papilledema   Neck: Normal range of motion. Neck supple.  No pain with neck flexion, no meningismus  Cardiovascular: Normal rate, regular rhythm and normal heart sounds.  Exam reveals no gallop and no friction rub.   No murmur heard. Pulmonary/Chest: Effort normal and breath sounds normal. No respiratory distress. She has no wheezes. She has no rales. She exhibits no tenderness.  Abdominal: Soft. Bowel sounds are normal. She exhibits no distension and no mass. There is no tenderness. There is no rebound and no guarding.  Musculoskeletal: Normal range of motion. She exhibits no edema and no tenderness.  Normal gait.  Neurological: She is alert and oriented to person, place, and time. She has normal reflexes.  CN 3-12 intact, no pronator drift, normal shin to heel, normal RAM, sensation and strength intact bilaterally.  Skin: Skin is warm and dry.  Psychiatric: She has a normal mood and affect. Her behavior is normal. Judgment and thought content normal.    ED Course  Procedures (including critical care time) Results for orders placed during the hospital encounter of 10/26/12  CBC      Result Value Range   WBC 8.7  4.0 - 10.5 K/uL   RBC 4.28  3.87 - 5.11 MIL/uL   Hemoglobin 11.1 (*) 12.0 - 15.0 g/dL   HCT 16.1 (*) 09.6 - 04.5 %   MCV 81.8  78.0 - 100.0 fL   MCH 25.9 (*) 26.0 - 34.0 pg   MCHC 31.7  30.0 - 36.0 g/dL   RDW 40.9 (*) 81.1 - 91.4 %   Platelets 302  150 - 400 K/uL  CREATININE, SERUM      Result Value Range   Creatinine, Ser 1.02  0.50 - 1.10 mg/dL   GFR calc non Af Amer 51 (*) >90 mL/min   GFR calc Af Amer 59 (*) >90 mL/min  CBC WITH DIFFERENTIAL      Result Value Range   WBC 7.4  4.0 - 10.5 K/uL   RBC 4.07  3.87 - 5.11 MIL/uL   Hemoglobin 10.3 (*) 12.0 - 15.0 g/dL   HCT 78.2 (*) 95.6 - 21.3 %   MCV 80.8  78.0 - 100.0 fL   MCH 25.3 (*) 26.0 - 34.0 pg   MCHC 31.3  30.0 - 36.0 g/dL   RDW 08.6 (*) 57.8 - 46.9 %   Platelets 253  150 -  400 K/uL   Neutrophils Relative % 53  43 - 77  %   Neutro Abs 3.9  1.7 - 7.7 K/uL   Lymphocytes Relative 29  12 - 46 %   Lymphs Abs 2.1  0.7 - 4.0 K/uL   Monocytes Relative 14 (*) 3 - 12 %   Monocytes Absolute 1.0  0.1 - 1.0 K/uL   Eosinophils Relative 4  0 - 5 %   Eosinophils Absolute 0.3  0.0 - 0.7 K/uL   Basophils Relative 0  0 - 1 %   Basophils Absolute 0.0  0.0 - 0.1 K/uL  COMPREHENSIVE METABOLIC PANEL      Result Value Range   Sodium 132 (*) 135 - 145 mEq/L   Potassium 4.1  3.5 - 5.1 mEq/L   Chloride 97  96 - 112 mEq/L   CO2 24  19 - 32 mEq/L   Glucose, Bld 95  70 - 99 mg/dL   BUN 18  6 - 23 mg/dL   Creatinine, Ser 1.61  0.50 - 1.10 mg/dL   Calcium 8.9  8.4 - 09.6 mg/dL   Total Protein 6.7  6.0 - 8.3 g/dL   Albumin 3.0 (*) 3.5 - 5.2 g/dL   AST 15  0 - 37 U/L   ALT 12  0 - 35 U/L   Alkaline Phosphatase 58  39 - 117 U/L   Total Bilirubin 0.3  0.3 - 1.2 mg/dL   GFR calc non Af Amer 56 (*) >90 mL/min   GFR calc Af Amer 65 (*) >90 mL/min  CREATININE, SERUM      Result Value Range   Creatinine, Ser 1.06  0.50 - 1.10 mg/dL   GFR calc non Af Amer 49 (*) >90 mL/min   GFR calc Af Amer 56 (*) >90 mL/min   Ct Head Wo Contrast  01/31/2013   *RADIOLOGY REPORT*  Clinical Data:  Larey Seat and hit head  CT HEAD WITHOUT CONTRAST CT CERVICAL SPINE WITHOUT CONTRAST  Technique:  Multidetector CT imaging of the head and cervical spine was performed following the standard protocol without intravenous contrast.  Multiplanar CT image reconstructions of the cervical spine were also generated.  Comparison:  10/21/2012 MRI  CT HEAD  Findings: No skull fracture.  No extra-axial fluid or parenchymal hemorrhage.  No infarct or mass.Mild encephalomalacia right posterior parietal lobe above the level of the ventricles in area of prior hemorrhage.  Chronic involutional change stable.  IMPRESSION: No acute findings.  CT CERVICAL SPINE  Findings: No fracture.  Significant multilevel degenerative disc disease.  Multilevel degenerative facet change.  This causes  minimal anterior listhesis of C7 on T1.  No prevertebral soft tissue swelling.  IMPRESSION: No acute traumatic injury   Original Report Authenticated By: Esperanza Heir, M.D.   Ct Cervical Spine Wo Contrast  01/31/2013   *RADIOLOGY REPORT*  Clinical Data:  Larey Seat and hit head  CT HEAD WITHOUT CONTRAST CT CERVICAL SPINE WITHOUT CONTRAST  Technique:  Multidetector CT imaging of the head and cervical spine was performed following the standard protocol without intravenous contrast.  Multiplanar CT image reconstructions of the cervical spine were also generated.  Comparison:  10/21/2012 MRI  CT HEAD  Findings: No skull fracture.  No extra-axial fluid or parenchymal hemorrhage.  No infarct or mass.Mild encephalomalacia right posterior parietal lobe above the level of the ventricles in area of prior hemorrhage.  Chronic involutional change stable.  IMPRESSION: No acute findings.  CT CERVICAL SPINE  Findings: No fracture.  Significant  multilevel degenerative disc disease.  Multilevel degenerative facet change.  This causes minimal anterior listhesis of C7 on T1.  No prevertebral soft tissue swelling.  IMPRESSION: No acute traumatic injury   Original Report Authenticated By: Esperanza Heir, M.D.      MDM   1. Head injury, initial encounter    Patient sent by urgent care for evaluation of head injury. Today, we'll order CT scan head and neck. Will reevaluate. Patient not on any blood thinners.   Patient seen by and discussed with Dr. Manus Gunning.  CTs are negative.  Will discharge patient to home in good condition.  Follow-up with PCP.  Patient understands and agrees with the plan.  Roxy Horseman, PA-C 01/31/13 1925

## 2013-01-31 NOTE — ED Provider Notes (Signed)
Medical screening examination/treatment/procedure(s) were conducted as a shared visit with non-physician practitioner(s) and myself.  I personally evaluated the patient during the encounter  Mechanical fall with head laceration repaired at urgent care. No LOC, vision change, focal weakness, numbness,tingling. No anticoagulant use. CN 2-12 intact, no ataxia on finger to nose, no nystagmus, 5/5 strength throughout, no pronator drift, Romberg negative, normal gait.   Glynn Octave, MD 01/31/13 2130

## 2013-01-31 NOTE — ED Notes (Signed)
Received pt from urgent care with c/o sent for CT of head. Pt tripped and fell today causing a head injury. Pt has 8 staples to top of head area.

## 2013-01-31 NOTE — ED Provider Notes (Signed)
Chief Complaint:   Chief Complaint  Patient presents with  . Head Laceration    History of Present Illness:   Nicole Sparks is a 79 year old female who tripped and fell at home and struck her head about an hour prior to coming here. There was no loss of consciousness, but she did sustain a large, deep laceration to her left parietal area. She's been acting normally since then. She does have a headache, but no nausea or vomiting. She denies any diplopia, blurry vision, or neurological symptoms. She also has a small laceration on her left forearm. She denies any other injuries to her torso or extremities.  Review of Systems:  Other than noted above, the patient denies any of the following symptoms: Systemic:  No fever or chills. Eye:  No eye pain, redness, diplopia or blurred vision ENT:  No bleeding from nose or ears.  No loose or broken teeth. Neck:  No pain or limited ROM. GI:  No nausea or vomiting. Neuro:  No loss of consciousness, seizure activity, numbness, tingling, or weakness.  PMFSH:  Past medical history, family history, social history, meds, and allergies were reviewed. No history of anticoagulent use.  She was hospitalized here in May because of a traumatic, intracerebral bleed. This resolved without any surgery or other specific treatment. She has a history of hyponatremia, restless leg syndrome, urethral stenosis, recurrent urinary tract infections, recurrent falls, duodenal disorder, iron deficiency anemia, and reflux esophagitis.  Physical Exam:   Vital signs:  BP 127/77  Pulse 89  Temp(Src) 98.4 F (36.9 C) (Oral)  Resp 18  SpO2 97% General:  Alert and oriented times 3.  In no distress. Eye:  PERRL, full EOMs.  Lids and conjunctivas normal. Fundi benign. HEENT:  There is a 5 cm laceration in the left parietal area which extends down to the skull. Does not seem to be any involvement of the skull itself. This was bleeding freely, and needed to be closed with staples for  hemostasis.  TMs and canals normal, nasal mucosa normal.  No oral lacerations.  Teeth were intact without obvious oral trauma. Neck:  Non tender.  Full ROM without pain. Extremities: There is a 2 cm skin tear on the left forearm. This is very shallow. There is no tenderness to palpation and full range of motion. Neurological:  Alert and oriented.  Cranial nerves intact.  No pronator drift. Finger to nose test was normal.  No muscle weakness. DTRs were symmetrical.  Sensation was intact to light touch. Gait was normal.  Romberg's sign negative.  Able to perform tandem gait well.  Course in Urgent Care Center:   The scalp wound was cleansed with Betadine and closed with 7 staples. The wound on the forearm was cleansed with saline and closed with Steri-Strips and tincture of benzoin. All wounds were then cleansed and dressed. Due to her age and history of intracranial bleed, she will need further evaluation, possibly a CT scan.  Assessment:  The primary encounter diagnosis was Head injury, initial encounter. Diagnoses of Scalp laceration, initial encounter and Forearm laceration, left, initial encounter were also pertinent to this visit.  No evidence of intracranial injury or bleed right now, but with her age and history, she needs further evaluation, possibly a CT scan.  Plan:   1.  The following meds were prescribed:   New Prescriptions   No medications on file   2.  The patient was transferred to the emergency department via shuttle for further evaluation.  Medical Decision Making: 79 year old female tripped and fell at home this afternoon, no LOC, but sustained a scalp lac and small skin tear on Lt arm.  She has a headache, but no neuro symptoms, nausea or vomiting.  Has a 5 cm laceration in left parietal area, neuro exam normal, small skin tear on left forearm.  The scalp lac was stapled.  The skin tear was Steri-Stripped.  She was sent to ED for further eval due to age.      Reuben Likes,  MD 01/31/13 [phone]

## 2013-01-31 NOTE — ED Notes (Signed)
Patient transported to CT 

## 2013-02-03 ENCOUNTER — Ambulatory Visit: Payer: Self-pay | Admitting: Podiatry

## 2013-02-03 LAB — BASIC METABOLIC PANEL
Anion Gap: 5 — ABNORMAL LOW (ref 7–16)
BUN: 30 mg/dL — ABNORMAL HIGH (ref 7–18)
Chloride: 106 mmol/L (ref 98–107)
Creatinine: 1.09 mg/dL (ref 0.60–1.30)
EGFR (African American): 56 — ABNORMAL LOW
EGFR (Non-African Amer.): 48 — ABNORMAL LOW
Glucose: 112 mg/dL — ABNORMAL HIGH (ref 65–99)
Osmolality: 283 (ref 275–301)
Potassium: 4.9 mmol/L (ref 3.5–5.1)
Sodium: 138 mmol/L (ref 136–145)

## 2013-02-12 ENCOUNTER — Ambulatory Visit: Payer: Self-pay | Admitting: Podiatry

## 2013-04-08 ENCOUNTER — Other Ambulatory Visit: Payer: Self-pay

## 2013-05-03 ENCOUNTER — Other Ambulatory Visit (HOSPITAL_COMMUNITY): Payer: Self-pay | Admitting: Urology

## 2013-05-03 DIAGNOSIS — Q600 Renal agenesis, unilateral: Secondary | ICD-10-CM

## 2013-05-03 DIAGNOSIS — N1330 Unspecified hydronephrosis: Secondary | ICD-10-CM

## 2013-05-05 ENCOUNTER — Ambulatory Visit (HOSPITAL_COMMUNITY)
Admission: RE | Admit: 2013-05-05 | Discharge: 2013-05-05 | Disposition: A | Discharge status: Home / Self Care | Payer: Medicare Other | Source: Ambulatory Visit | Attending: Urology | Admitting: Urology

## 2013-05-05 DIAGNOSIS — Q600 Renal agenesis, unilateral: Secondary | ICD-10-CM

## 2013-05-05 DIAGNOSIS — N1330 Unspecified hydronephrosis: Secondary | ICD-10-CM | POA: Insufficient documentation

## 2013-05-05 MED ORDER — FUROSEMIDE 10 MG/ML IJ SOLN
20.0000 mg | Freq: Once | INTRAMUSCULAR | Status: AC
  Administered 2013-05-05: 20 mg via INTRAVENOUS
  Filled 2013-05-05: qty 2

## 2013-05-06 MED ORDER — TECHNETIUM TC 99M MERTIATIDE
16.2000 | Freq: Once | INTRAVENOUS | Status: AC | PRN
  Administered 2013-05-06: 16 via INTRAVENOUS

## 2013-05-20 ENCOUNTER — Emergency Department (INDEPENDENT_AMBULATORY_CARE_PROVIDER_SITE_OTHER)
Admission: EM | Admit: 2013-05-20 | Discharge: 2013-05-20 | Disposition: A | Discharge status: Home / Self Care | Payer: Medicare Other | Source: Home / Self Care | Attending: Family Medicine | Admitting: Family Medicine

## 2013-05-20 ENCOUNTER — Encounter (HOSPITAL_COMMUNITY): Payer: Self-pay | Admitting: Emergency Medicine

## 2013-05-20 DIAGNOSIS — S0180XA Unspecified open wound of other part of head, initial encounter: Secondary | ICD-10-CM

## 2013-05-20 DIAGNOSIS — S0181XA Laceration without foreign body of other part of head, initial encounter: Secondary | ICD-10-CM

## 2013-05-20 NOTE — ED Provider Notes (Signed)
CSN: [id number]     Arrival date & time 05/20/13  1548 History   First MD Initiated Contact with Patient 05/20/13 1631     Chief Complaint  Patient presents with  . Laceration   (Consider location/radiation/quality/duration/timing/severity/associated sxs/prior Treatment) HPI Comments: 38f presents c/o laceration above left eye.  Stumbled and hit her head on a wall a couple hours ago.  She remembers everything leading up to this and everything directly after impact, no LOC. Bleeding controlled with direct pressure.  No lightheadedness, nausea, vomiting, headache, blurry vision, or any other systemic sxs at this time.    Patient is a 79 y.o. female presenting with skin laceration.  Laceration   Past Medical History  Diagnosis Date  . COPD (chronic obstructive pulmonary disease)   . Asthma   . Arthritis   . Renal disorder   . Renal insufficiency    Past Surgical History  Procedure Laterality Date  . Revision urostomy cutaneous    . Bladder surgery    . Abdominal hysterectomy    . Esophagogastroduodenoscopy N/A 10/24/2012    Procedure: ESOPHAGOGASTRODUODENOSCOPY (EGD);  Surgeon: Hart Carwin, MD;  Location: [facility] ENDOSCOPY;  Service: Endoscopy;  Laterality: N/A;   History reviewed. No pertinent family history. History  Substance Use Topics  . Smoking status: Former Smoker    Quit date: 10/21/1988  . Smokeless tobacco: Never Used  . Alcohol Use: 0.0 oz/week    0 Glasses of wine per week     Comment: daily   OB History   Grav Para Term Preterm Abortions TAB SAB Ect Mult Living                 Review of Systems  Constitutional: Negative for fever and chills.  Eyes: Negative for visual disturbance.  Respiratory: Negative for cough and shortness of breath.   Cardiovascular: Negative for chest pain, palpitations and leg swelling.  Gastrointestinal: Negative for nausea, vomiting and abdominal pain.  Endocrine: Negative for polydipsia and polyuria.  Genitourinary: Negative for  dysuria, urgency and frequency.  Musculoskeletal: Negative for arthralgias and myalgias.  Skin: Positive for wound. Negative for rash.  Neurological: Negative for dizziness, syncope, speech difficulty, weakness, light-headedness and headaches.    Allergies  Sudafed  Home Medications   Current Outpatient Rx  Name  Route  Sig  Dispense  Refill  . budesonide-formoterol (SYMBICORT) 80-4.5 MCG/ACT inhaler   Inhalation   Inhale 2 puffs into the lungs 2 (two) times daily as needed (for shortnes of breath).         . cetirizine (ZYRTEC) 10 MG tablet   Oral   Take 10 mg by mouth daily as needed for allergies.         . citalopram (CELEXA) 40 MG tablet   Oral   Take 1 tablet (40 mg total) by mouth daily.   30 tablet   1   . Menthol, Topical Analgesic, (BENGAY EX)   Topical   Apply 1 application topically at bedtime as needed (for knee/back pain).         . Multiple Vitamin (MULTIVITAMIN WITH MINERALS) TABS   Oral   Take 1 tablet by mouth daily.         . nitrofurantoin, macrocrystal-monohydrate, (MACROBID) 100 MG capsule   Oral   Take 1 capsule (100 mg total) by mouth daily.   30 capsule   1   . nortriptyline (PAMELOR) 10 MG capsule   Oral   Take 1 capsule (10 mg total) by mouth  at bedtime.   30 capsule   6   . omeprazole (PRILOSEC) 20 MG capsule   Oral   Take 1 capsule (20 mg total) by mouth daily as needed (for heartburn).   30 capsule   1   . traMADol (ULTRAM) 50 MG tablet   Oral   Take 1 tablet (50 mg total) by mouth every 4 (four) hours as needed for pain.   60 tablet   0    BP 148/73  Pulse 98  Temp(Src) 99 F (37.2 C) (Oral)  Resp 18  SpO2 100% Physical Exam  Nursing note and vitals reviewed. Constitutional: She is oriented to person, place, and time. Vital signs are normal. She appears well-developed and well-nourished. No distress.  HENT:  Head: Normocephalic and atraumatic.    Pulmonary/Chest: Effort normal. No respiratory distress.    Neurological: She is alert and oriented to person, place, and time. She has normal strength. Coordination normal.  Skin: Skin is warm and dry. No rash noted. She is not diaphoretic.  Psychiatric: She has a normal mood and affect. Judgment normal.    ED Course  LACERATION REPAIR Date/Time: 05/21/2013 8:31 AM Performed by: Autumn Messing, H Authorized by: Autumn Messing, H Consent: Verbal consent obtained. Risks and benefits: risks, benefits and alternatives were discussed Consent given by: patient Patient understanding: patient states understanding of the procedure being performed Patient identity confirmed: verbally with patient Time out: Immediately prior to procedure a "time out" was called to verify the correct patient, procedure, equipment, support staff and site/side marked as required. Body area: head/neck Location details: forehead Laceration length: 3 cm Foreign bodies: no foreign bodies Tendon involvement: none Nerve involvement: none Vascular damage: no Anesthesia: local infiltration Local anesthetic: lidocaine 2% with epinephrine Anesthetic total: 4 ml Preparation: Patient was prepped and draped in the usual sterile fashion. Irrigation solution: saline Amount of cleaning: standard Skin closure: 5-0 Prolene Number of sutures: 4 Technique: simple Approximation: close Approximation difficulty: simple Dressing: antibiotic ointment and 4x4 sterile gauze Patient tolerance: Patient tolerated the procedure well with no immediate complications.   (including critical care time) Labs Review Labs Reviewed - No data to display Imaging Review No results found.    MDM   1. Simple laceration of face    Lac repaired 4 simple interrupted with 5-0 prolene.  F/u 7 days for removal   Graylon Good, PA-C 05/21/13 [phone]

## 2013-05-20 NOTE — ED Notes (Signed)
Reported  Off   To  Suzanne    

## 2013-05-20 NOTE — ED Notes (Signed)
Suture tray set up for PA.

## 2013-05-20 NOTE — ED Notes (Signed)
Pt    Reports  She  Felled   Several  Hours  Ago  And  Sustained  A  lacertion to l  Side  Of her  Head  -  Bleeding  Has  Subsided  She  Did  Not  Black ou t  She  Remembers  Everything        She  Has  Not  Vomited           She  Is  Awake  And  Alert and  Oriented

## 2013-05-21 NOTE — ED Provider Notes (Signed)
Medical screening examination/treatment/procedure(s) were performed by resident physician or non-physician practitioner and as supervising physician I was immediately available for consultation/collaboration.   Chyann Ambrocio DOUGLAS MD.   Joachim Carton D Daniela Siebers, MD 05/21/13 1705 

## 2013-06-21 ENCOUNTER — Other Ambulatory Visit: Payer: Self-pay

## 2013-06-23 ENCOUNTER — Telehealth: Payer: Self-pay

## 2013-06-23 ENCOUNTER — Other Ambulatory Visit: Payer: Self-pay

## 2013-06-23 MED ORDER — NORTRIPTYLINE HCL 10 MG PO CAPS: 10.0000 mg | ORAL_CAPSULE | Freq: Every day | ORAL | Status: AC

## 2013-06-23 NOTE — Telephone Encounter (Signed)
Yes

## 2013-06-23 NOTE — Telephone Encounter (Signed)
Pharmacy request has been sent to refill patient's Nortriptyline 10 mg. Is this okay?

## 2013-06-23 NOTE — Telephone Encounter (Signed)
Refill sent to pharmacy.   

## 2013-06-23 NOTE — Telephone Encounter (Signed)
Nortriptyline refill sent to pharmacy. Approved by Dr. Riley KillSwartz.

## 2013-07-11 ENCOUNTER — Encounter (HOSPITAL_COMMUNITY): Payer: Medicare Other | Admitting: Anesthesiology

## 2013-07-11 ENCOUNTER — Encounter (HOSPITAL_COMMUNITY): Payer: Self-pay | Admitting: Emergency Medicine

## 2013-07-11 ENCOUNTER — Encounter (HOSPITAL_COMMUNITY): Admission: EM | Disposition: E | Payer: Self-pay | Source: Home / Self Care

## 2013-07-11 ENCOUNTER — Inpatient Hospital Stay (HOSPITAL_COMMUNITY): Payer: Medicare Other

## 2013-07-11 ENCOUNTER — Emergency Department (HOSPITAL_COMMUNITY): Payer: Medicare Other

## 2013-07-11 ENCOUNTER — Inpatient Hospital Stay (HOSPITAL_COMMUNITY): Payer: Medicare Other | Admitting: Anesthesiology

## 2013-07-11 ENCOUNTER — Inpatient Hospital Stay (HOSPITAL_COMMUNITY)
Admission: EM | Admit: 2013-07-11 | Discharge: 2013-08-01 | DRG: 199 | Disposition: E | Payer: Medicare Other | Attending: Interventional Radiology | Admitting: Interventional Radiology

## 2013-07-11 DIAGNOSIS — S065X0A Traumatic subdural hemorrhage without loss of consciousness, initial encounter: Secondary | ICD-10-CM | POA: Diagnosis present

## 2013-07-11 DIAGNOSIS — Z66 Do not resuscitate: Secondary | ICD-10-CM | POA: Diagnosis present

## 2013-07-11 DIAGNOSIS — S51809A Unspecified open wound of unspecified forearm, initial encounter: Secondary | ICD-10-CM | POA: Diagnosis present

## 2013-07-11 DIAGNOSIS — S272XXA Traumatic hemopneumothorax, initial encounter: Secondary | ICD-10-CM

## 2013-07-11 DIAGNOSIS — S22009A Unspecified fracture of unspecified thoracic vertebra, initial encounter for closed fracture: Secondary | ICD-10-CM | POA: Diagnosis present

## 2013-07-11 DIAGNOSIS — S42109A Fracture of unspecified part of scapula, unspecified shoulder, initial encounter for closed fracture: Secondary | ICD-10-CM

## 2013-07-11 DIAGNOSIS — IMO0002 Reserved for concepts with insufficient information to code with codable children: Secondary | ICD-10-CM | POA: Diagnosis present

## 2013-07-11 DIAGNOSIS — S066XAA Traumatic subarachnoid hemorrhage with loss of consciousness status unknown, initial encounter: Secondary | ICD-10-CM

## 2013-07-11 DIAGNOSIS — J4489 Other specified chronic obstructive pulmonary disease: Secondary | ICD-10-CM | POA: Diagnosis present

## 2013-07-11 DIAGNOSIS — S329XXA Fracture of unspecified parts of lumbosacral spine and pelvis, initial encounter for closed fracture: Secondary | ICD-10-CM

## 2013-07-11 DIAGNOSIS — S065X9A Traumatic subdural hemorrhage with loss of consciousness of unspecified duration, initial encounter: Secondary | ICD-10-CM

## 2013-07-11 DIAGNOSIS — S32509A Unspecified fracture of unspecified pubis, initial encounter for closed fracture: Secondary | ICD-10-CM | POA: Diagnosis present

## 2013-07-11 DIAGNOSIS — Z936 Other artificial openings of urinary tract status: Secondary | ICD-10-CM

## 2013-07-11 DIAGNOSIS — J942 Hemothorax: Secondary | ICD-10-CM

## 2013-07-11 DIAGNOSIS — I609 Nontraumatic subarachnoid hemorrhage, unspecified: Secondary | ICD-10-CM

## 2013-07-11 DIAGNOSIS — S82899A Other fracture of unspecified lower leg, initial encounter for closed fracture: Secondary | ICD-10-CM | POA: Diagnosis present

## 2013-07-11 DIAGNOSIS — S81009A Unspecified open wound, unspecified knee, initial encounter: Secondary | ICD-10-CM | POA: Diagnosis present

## 2013-07-11 DIAGNOSIS — T07XXXA Unspecified multiple injuries, initial encounter: Secondary | ICD-10-CM

## 2013-07-11 DIAGNOSIS — Z87891 Personal history of nicotine dependence: Secondary | ICD-10-CM

## 2013-07-11 DIAGNOSIS — S2249XA Multiple fractures of ribs, unspecified side, initial encounter for closed fracture: Secondary | ICD-10-CM

## 2013-07-11 DIAGNOSIS — S065XAA Traumatic subdural hemorrhage with loss of consciousness status unknown, initial encounter: Secondary | ICD-10-CM

## 2013-07-11 DIAGNOSIS — S81809A Unspecified open wound, unspecified lower leg, initial encounter: Secondary | ICD-10-CM

## 2013-07-11 DIAGNOSIS — J449 Chronic obstructive pulmonary disease, unspecified: Secondary | ICD-10-CM | POA: Diagnosis present

## 2013-07-11 DIAGNOSIS — S322XXA Fracture of coccyx, initial encounter for closed fracture: Secondary | ICD-10-CM

## 2013-07-11 DIAGNOSIS — J9383 Other pneumothorax: Principal | ICD-10-CM | POA: Diagnosis present

## 2013-07-11 DIAGNOSIS — S91009A Unspecified open wound, unspecified ankle, initial encounter: Secondary | ICD-10-CM

## 2013-07-11 DIAGNOSIS — J939 Pneumothorax, unspecified: Secondary | ICD-10-CM

## 2013-07-11 DIAGNOSIS — S066X9A Traumatic subarachnoid hemorrhage with loss of consciousness of unspecified duration, initial encounter: Secondary | ICD-10-CM

## 2013-07-11 DIAGNOSIS — S270XXA Traumatic pneumothorax, initial encounter: Secondary | ICD-10-CM

## 2013-07-11 DIAGNOSIS — N189 Chronic kidney disease, unspecified: Secondary | ICD-10-CM | POA: Diagnosis present

## 2013-07-11 DIAGNOSIS — I469 Cardiac arrest, cause unspecified: Secondary | ICD-10-CM | POA: Diagnosis not present

## 2013-07-11 DIAGNOSIS — J90 Pleural effusion, not elsewhere classified: Secondary | ICD-10-CM | POA: Diagnosis present

## 2013-07-11 DIAGNOSIS — S32309A Unspecified fracture of unspecified ilium, initial encounter for closed fracture: Secondary | ICD-10-CM | POA: Diagnosis present

## 2013-07-11 DIAGNOSIS — S3210XA Unspecified fracture of sacrum, initial encounter for closed fracture: Secondary | ICD-10-CM | POA: Diagnosis present

## 2013-07-11 HISTORY — PX: RADIOLOGY WITH ANESTHESIA: SHX6223

## 2013-07-11 LAB — COMPREHENSIVE METABOLIC PANEL
ALT: 72 U/L — ABNORMAL HIGH (ref 0–35)
AST: 115 U/L — AB (ref 0–37)
Albumin: 2.8 g/dL — ABNORMAL LOW (ref 3.5–5.2)
Alkaline Phosphatase: 59 U/L (ref 39–117)
BILIRUBIN TOTAL: 0.2 mg/dL — AB (ref 0.3–1.2)
BUN: 27 mg/dL — ABNORMAL HIGH (ref 6–23)
CALCIUM: 8 mg/dL — AB (ref 8.4–10.5)
CHLORIDE: 107 meq/L (ref 96–112)
CO2: 18 meq/L — AB (ref 19–32)
CREATININE: 1.51 mg/dL — AB (ref 0.50–1.10)
GFR calc Af Amer: 37 mL/min — ABNORMAL LOW (ref >90)
GFR, EST NON AFRICAN AMERICAN: 32 mL/min — AB (ref >90)
Glucose, Bld: 303 mg/dL — ABNORMAL HIGH (ref 70–99)
Potassium: 4.2 mEq/L (ref 3.7–5.3)
Sodium: 142 mEq/L (ref 137–147)
Total Protein: 5.7 g/dL — ABNORMAL LOW (ref 6.0–8.3)

## 2013-07-11 LAB — PREPARE PLATELET PHERESIS: Unit division: 0

## 2013-07-11 LAB — CBC WITH DIFFERENTIAL/PLATELET
BASOS ABS: 0 10*3/uL (ref 0.0–0.1)
Basophils Relative: 0 % (ref 0–1)
EOS PCT: 1 % (ref 0–5)
Eosinophils Absolute: 0.2 10*3/uL (ref 0.0–0.7)
HEMATOCRIT: 28.9 % — AB (ref 36.0–46.0)
Hemoglobin: 8.9 g/dL — ABNORMAL LOW (ref 12.0–15.0)
Lymphocytes Relative: 16 % (ref 12–46)
Lymphs Abs: 3.8 10*3/uL (ref 0.7–4.0)
MCH: 28.9 pg (ref 26.0–34.0)
MCHC: 30.8 g/dL (ref 30.0–36.0)
MCV: 93.8 fL (ref 78.0–100.0)
MONO ABS: 1.4 10*3/uL — AB (ref 0.1–1.0)
Monocytes Relative: 6 % (ref 3–12)
Neutro Abs: 18.4 10*3/uL — ABNORMAL HIGH (ref 1.7–7.7)
Neutrophils Relative %: 77 % (ref 43–77)
PLATELETS: 299 10*3/uL (ref 150–400)
RBC: 3.08 MIL/uL — ABNORMAL LOW (ref 3.87–5.11)
RDW: 14.3 % (ref 11.5–15.5)
WBC: 23.8 10*3/uL — ABNORMAL HIGH (ref 4.0–10.5)

## 2013-07-11 LAB — POCT I-STAT TROPONIN I: Troponin i, poc: 0.02 ng/mL (ref 0.00–0.08)

## 2013-07-11 LAB — POCT I-STAT, CHEM 8
BUN: 27 mg/dL — AB (ref 6–23)
CHLORIDE: 104 meq/L (ref 96–112)
Calcium, Ion: 1.18 mmol/L (ref 1.13–1.30)
Creatinine, Ser: 1.7 mg/dL — ABNORMAL HIGH (ref 0.50–1.10)
Glucose, Bld: 287 mg/dL — ABNORMAL HIGH (ref 70–99)
HCT: 29 % — ABNORMAL LOW (ref 36.0–46.0)
Hemoglobin: 9.9 g/dL — ABNORMAL LOW (ref 12.0–15.0)
Potassium: 3.9 mEq/L (ref 3.7–5.3)
SODIUM: 140 meq/L (ref 137–147)
TCO2: 19 mmol/L (ref 0–100)

## 2013-07-11 LAB — PROTIME-INR
INR: 1.37 (ref 0.00–1.49)
PROTHROMBIN TIME: 16.5 s — AB (ref 11.6–15.2)

## 2013-07-11 LAB — MASSIVE TRANSFUSION PROTOCOL ORDER (BLOOD BANK NOTIFICATION)

## 2013-07-11 LAB — PREPARE RBC (CROSSMATCH)

## 2013-07-11 LAB — ABO/RH: ABO/RH(D): A POS

## 2013-07-11 LAB — CG4 I-STAT (LACTIC ACID): LACTIC ACID, VENOUS: 5.74 mmol/L — AB (ref 0.5–2.2)

## 2013-07-11 SURGERY — RADIOLOGY WITH ANESTHESIA
Anesthesia: General

## 2013-07-11 MED ORDER — SODIUM CHLORIDE 0.9 % IV SOLN
INTRAVENOUS | Status: DC | PRN
  Administered 2013-07-11 (×2): via INTRAVENOUS

## 2013-07-11 MED ORDER — SUCCINYLCHOLINE CHLORIDE 20 MG/ML IJ SOLN
INTRAMUSCULAR | Status: DC | PRN
  Administered 2013-07-11: 80 mg via INTRAVENOUS

## 2013-07-11 MED ORDER — TETANUS-DIPHTH-ACELL PERTUSSIS 5-2.5-18.5 LF-MCG/0.5 IM SUSP: 0.5000 mL | Freq: Once | INTRAMUSCULAR | Status: DC

## 2013-07-11 MED ORDER — SODIUM BICARBONATE 4.2 % IV SOLN
INTRAVENOUS | Status: DC | PRN
  Administered 2013-07-11 (×2): 50 meq via INTRAVENOUS

## 2013-07-11 MED ORDER — ONDANSETRON HCL 4 MG/2ML IJ SOLN
4.0000 mg | Freq: Once | INTRAMUSCULAR | Status: AC
  Administered 2013-07-11: 4 mg via INTRAVENOUS

## 2013-07-11 MED ORDER — GELATIN ABSORBABLE 12-7 MM EX MISC
CUTANEOUS | Status: AC
  Filled 2013-07-11: qty 3

## 2013-07-11 MED ORDER — EPINEPHRINE HCL 0.1 MG/ML IJ SOSY
PREFILLED_SYRINGE | INTRAMUSCULAR | Status: DC | PRN
  Administered 2013-07-11 (×6): 1 mg via INTRAVENOUS

## 2013-07-11 MED ORDER — VECURONIUM BROMIDE 10 MG IV SOLR
INTRAVENOUS | Status: DC | PRN
  Administered 2013-07-11: 10 mg via INTRAVENOUS

## 2013-07-11 MED ORDER — IOHEXOL 300 MG/ML  SOLN
150.0000 mL | Freq: Once | INTRAMUSCULAR | Status: AC | PRN
  Administered 2013-07-11: 20 mL via INTRAVENOUS

## 2013-07-11 MED ORDER — ONDANSETRON HCL 4 MG/2ML IJ SOLN
INTRAMUSCULAR | Status: AC
  Filled 2013-07-11: qty 2

## 2013-07-11 MED ORDER — SODIUM CHLORIDE 0.9 % IV SOLN: Freq: Once | INTRAVENOUS | Status: DC

## 2013-07-11 MED ORDER — CALCIUM CHLORIDE 10 % IV SOLN
INTRAVENOUS | Status: DC | PRN
  Administered 2013-07-11: 1 g via INTRAVENOUS
  Administered 2013-07-11 (×2): 0.5 g via INTRAVENOUS

## 2013-07-11 MED ORDER — FENTANYL CITRATE 0.05 MG/ML IJ SOLN: 50.0000 ug | Freq: Once | INTRAMUSCULAR | Status: DC

## 2013-07-11 MED ORDER — PHENYLEPHRINE HCL 10 MG/ML IJ SOLN
INTRAMUSCULAR | Status: DC | PRN
  Administered 2013-07-11 (×6): 100 ug via INTRAVENOUS

## 2013-07-11 MED ORDER — IOHEXOL 300 MG/ML  SOLN
80.0000 mL | Freq: Once | INTRAMUSCULAR | Status: AC | PRN
  Administered 2013-07-11: 80 mL via INTRAVENOUS

## 2013-07-11 MED ORDER — FENTANYL CITRATE 0.05 MG/ML IJ SOLN
INTRAMUSCULAR | Status: AC
  Filled 2013-07-11: qty 2

## 2013-07-11 MED ORDER — CEFAZOLIN SODIUM 1-5 GM-% IV SOLN: 1.0000 g | Freq: Once | INTRAVENOUS | Status: DC

## 2013-07-11 MED ORDER — ETOMIDATE 2 MG/ML IV SOLN
INTRAVENOUS | Status: DC | PRN
  Administered 2013-07-11: 10 mg via INTRAVENOUS

## 2013-07-12 ENCOUNTER — Encounter (HOSPITAL_COMMUNITY): Payer: Self-pay | Admitting: Interventional Radiology

## 2013-07-12 LAB — PREPARE FRESH FROZEN PLASMA
Unit division: 0
Unit division: 0
Unit division: 0
Unit division: 0

## 2013-07-12 LAB — TYPE AND SCREEN
ABO/RH(D): A POS
Antibody Screen: NEGATIVE
UNIT DIVISION: 0
UNIT DIVISION: 0
UNIT DIVISION: 0
UNIT DIVISION: 0
UNIT DIVISION: 0
UNIT DIVISION: 0
Unit division: 0
Unit division: 0
Unit division: 0
Unit division: 0
Unit division: 0

## 2013-07-12 LAB — CDS SEROLOGY

## 2013-07-12 MED FILL — Medication: Qty: 1 | Status: AC

## 2013-07-13 MED FILL — Sodium Bicarbonate IV Soln 8.4%: INTRAVENOUS | Qty: 50 | Status: AC

## 2013-07-13 MED FILL — Calcium Chloride Inj 10%: INTRAVENOUS | Qty: 10 | Status: AC
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
## 2013-08-01 NOTE — ED Notes (Signed)
Pt transported to IR 

## 2013-08-01 NOTE — Progress Notes (Signed)
Chaplain offered grief support through ministry of presence, compassionate conversation and empathic listening.  Chaplain also acted as Print production plannerliaison between pt's family and ED staff.     10-Mar-2014 1900  Clinical Encounter Type  Visited With Family  Visit Type Death;ED  Spiritual Encounters  Spiritual Needs Prayer;Grief support    Rulon Abideavid B Sherrod, chaplain pager [phone]

## 2013-08-01 NOTE — ED Notes (Signed)
The pt returned to the ed from ir at 1803.  She was pronounced in ir at [facility]1736

## 2013-08-01 NOTE — ED Notes (Signed)
Patient time of death occurred at [id number]

## 2013-08-01 NOTE — ED Notes (Signed)
Attempt to get temp when the pt first arrived unable due to the pts critical status and evolving status change

## 2013-08-01 NOTE — ED Notes (Addendum)
1st unit emergency release blood started.  O-Neg.  Unit # [id number].  Exp date 07/14/2013.

## 2013-08-01 NOTE — Anesthesia Preprocedure Evaluation (Addendum)
Anesthesia Evaluation    Airway Mallampati: II      Dental  (+) Partial Upper   Pulmonary former smoker,          Cardiovascular Rhythm:Regular Rate:Tachycardia     Neuro/Psych    GI/Hepatic   Endo/Other    Renal/GU      Musculoskeletal   Abdominal   Peds  Hematology   Anesthesia Other Findings   Reproductive/Obstetrics                          Anesthesia Physical Anesthesia Plan  ASA: V and emergent  Anesthesia Plan: General   Post-op Pain Management:    Induction: Intravenous, Rapid sequence and Cricoid pressure planned  Airway Management Planned: Oral ETT and Video Laryngoscope Planned  Additional Equipment:   Intra-op Plan:   Post-operative Plan:   Informed Consent:   Plan Discussed with:   Anesthesia Plan Comments:        Anesthesia Quick Evaluation

## 2013-08-01 NOTE — Progress Notes (Signed)
Orthopedic Tech Progress Note Patient Details:  Nicole ReusMargaret Sauls 1932/02/10 [id number]  Ortho Devices Type of Ortho Device: Ace wrap;Stirrup splint;Short leg splint;Short arm splint;Post (short leg) splint Ortho Device/Splint Interventions: Application   Cammer, Mickie BailJennifer Carol 07/21/2013, 4:53 PM
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
## 2013-08-01 NOTE — ED Notes (Signed)
2nd unit emergency release blood started 1620.  O-Neg.  Unit # [id number].  Exp date 07/17/2013.  Verified by Karna ChristmasAngela K, RN and Chrisandra Nettershris C, RN.

## 2013-08-01 NOTE — ED Notes (Signed)
Anesthesia at the bedside preparing to intubate the pt.
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
## 2013-08-01 NOTE — Anesthesia Postprocedure Evaluation (Signed)
  Anesthesia Post-op Note  Patient: Nicole Sparks  Procedure(s) Performed: Procedure(s): RADIOLOGY WITH ANESTHESIA (N/A)  Patient Location: Radiology  Anesthesia Type:General  Level of Consciousness: Deceased  Airway and Oxygen Therapy: N/A  Post-op Pain: N/A  Post-op Assessment: Deceased  Post-op Vital Signs: N/A  Complications: death

## 2013-08-01 NOTE — Transfer of Care (Signed)
Immediate Anesthesia Transfer of Care Note  Patient: Ferne ReusMargaret Brazel  Procedure(s) Performed: Intubation  Patient Location:ED Anesthesia Type:General  Level of Consciousness: sedated, unresponsive and Patient remains intubated per anesthesia plan  Airway & Oxygen Therapy: Patient remains intubated per anesthesia plan and Patient placed on Ventilator (see vital sign flow sheet for setting)  Post-op Assessment: Post -op Vital signs reviewed and unstable, Anesthesiologist notified  Post vital signs: unstable  Complications: No apparent anesthesia complications

## 2013-08-01 NOTE — ED Notes (Addendum)
3rd bag blood, 1st bag type and screen started 1659.  Unit [id number].  Exp date: 07/28/2013.  Verified by Karna ChristmasAngela K, RN and Chrisandra Nettershris C, RN.

## 2013-08-01 NOTE — ED Notes (Signed)
Organ procurement team notified. 1840

## 2013-08-01 NOTE — Transfer of Care (Deleted)
Immediate Anesthesia Transfer of Care Note  Patient: Nicole Sparks  Procedure(s) Performed: * No procedures listed *  Patient Location: ED  Anesthesia Type:General  Level of Consciousness: sedated, unresponsive and Patient remains intubated per anesthesia plan  Airway & Oxygen Therapy: Patient remains intubated per anesthesia plan and Patient placed on Ventilator (see vital sign flow sheet for setting)  Post-op Assessment In ED  Post vital signs: Reviewed and unstable  Complications: No apparent anesthesia complications

## 2013-08-01 NOTE — Progress Notes (Signed)
Chaplain visited with pt's 2 daughters who were in the main ED waiting area.  Chaplain offered his support through compassionate conversation and acting as liaison between pt's daughters and ED staff.  Chaplain reported to ED secretary that he had visited with pt's daughters and to let chaplain know if chaplain is needed further.   May 20, 2014 1500  Clinical Encounter Type  Visited With Family;Patient not available  Visit Type ED;Trauma  Stress Factors  Family Stress Factors Lack of knowledge   Rulon Abideavid B Sherrod, chaplain, pager [phone]
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
## 2013-08-01 NOTE — Transfer of Care (Signed)
Immediate Anesthesia Transfer of Care Note  Patient: Nicole ReusMargaret Touchton  Procedure(s) Performed: Procedure(s): RADIOLOGY WITH ANESTHESIA (N/A)  Patient Location: Emergency Dept  Anesthesia Type:General  Level of Consciousness: deceased  Airway & Oxygen Therapy: none  Post-op Assessment: none  Post vital signs: none   Complications: deceased

## 2013-08-01 NOTE — H&P (Signed)
Prior to Admission medications   Medication Sig Start Date End Date Taking? Authorizing Provider  budesonide-formoterol (SYMBICORT) 80-4.5 MCG/ACT inhaler Inhale 2 puffs into the lungs 2 (two) times daily as needed (for shortnes of breath).    Historical Provider, MD  cetirizine (ZYRTEC) 10 MG tablet Take 10 mg by mouth daily as needed for allergies.    Historical Provider, MD  citalopram (CELEXA) 40 MG tablet Take 1 tablet (40 mg total) by mouth daily. 11/04/12   Mcarthur Rossettianiel J Angiulli, PA-C  Menthol, Topical Analgesic, (BENGAY EX) Apply 1 application topically at bedtime as needed (for knee/back pain).    Historical Provider, MD  Multiple Vitamin (MULTIVITAMIN WITH MINERALS) TABS Take 1 tablet by mouth daily.    Historical Provider, MD  nitrofurantoin, macrocrystal-monohydrate, (MACROBID) 100 MG capsule Take 1 capsule (100 mg total) by mouth daily. 11/04/12   Mcarthur Rossettianiel J Angiulli, PA-C  nortriptyline (PAMELOR) 10 MG capsule Take 1 capsule (10 mg total) by mouth at bedtime. 06/23/13   Ranelle OysterZachary T Swartz, MD  omeprazole (PRILOSEC) 20 MG capsule Take 1 capsule (20 mg total) by mouth daily as needed (for heartburn). 11/04/12   Mcarthur Rossettianiel J Angiulli, PA-C  traMADol (ULTRAM) 50 MG tablet Take 1 tablet (50 mg total) by mouth every 4 (four) hours as needed for pain. 11/04/12   Mcarthur Rossettianiel J Angiulli, PA-C    Left leg laceration closed with staples Right wrist degloving injury partially closed with staples Left posterior hand laceration - irrigated; dressed  Chest tube placed with resolution of right pneumothorax. Patient to IR for angioembolization of pelvic arterial injury.  Patient is DNR per family - OK with intubation, but no CPR, ACLS, or defibrillation    Molli HazardMatthew K. Corliss Skainssuei, MD, [facility]  General/ Trauma Surgery  07/09/2013 5:01 PM

## 2013-08-01 NOTE — ED Notes (Signed)
Patient to morgue at this time.

## 2013-08-01 NOTE — Progress Notes (Signed)
I have reviewed multiple x-rays of the scapula, left ankle, and pelvis with Dr. Corliss Skainssuei.  This is obviously a very severe and life-threatening conglomerate of injuries for this patient. There is no indication for surgical intervention for the pelvis or scapula fractures.  The patient ultimately will need open reduction internal fixation of her left ankle fracturebut for now she will just be treated in a splint.  I will follow and do a more formal consult note as her overall situation dictates.  The trauma team is aware that that I have reviewed her x-rays and am available at any time should additional orthopedic care be needed.
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
## 2013-08-01 NOTE — ED Notes (Signed)
[facility] POC lab results reported to DR.Belfi. ED-Lab.

## 2013-08-01 NOTE — ED Notes (Signed)
Pt intubated  By anesthesia

## 2013-08-01 NOTE — Anesthesia Preprocedure Evaluation (Addendum)
Anesthesia Evaluation  Patient identified by MRN, date of birth, ID band Patient unresponsive    Reviewed: Unable to perform ROS - Chart review only  Airway       Dental   Pulmonary former smoker,          Cardiovascular     Neuro/Psych    GI/Hepatic   Endo/Other    Renal/GU      Musculoskeletal   Abdominal   Peds  Hematology   Anesthesia Other Findings   Reproductive/Obstetrics                          Anesthesia Physical Anesthesia Plan  ASA: V and emergent  Anesthesia Plan:    Post-op Pain Management:    Induction:   Airway Management Planned:   Additional Equipment:   Intra-op Plan:   Post-operative Plan:   Informed Consent:   Plan Discussed with:   Anesthesia Plan Comments:         Anesthesia Quick Evaluation

## 2013-08-01 NOTE — ED Notes (Signed)
Bed canceled in bed control

## 2013-08-01 NOTE — ED Notes (Signed)
The family has left the dept and the pt  Is ready for the morgue

## 2013-08-01 NOTE — ED Notes (Signed)
[place] donor notified they have rekleased her from any donations by diane smith.   Case number [id number]

## 2013-08-01 NOTE — ED Notes (Signed)
Dr Janee Mornhan in to see and talk to family and patient

## 2013-08-01 DEATH — deceased
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
## 2014-09-23 NOTE — Op Note (Signed)
PATIENT NAME:  Nicole SleightDAVIS, Jason C MR#:  [id number] DATE OF BIRTH:  11/06/1931  DATE OF PROCEDURE:  02/12/2013  PREOPERATIVE DIAGNOSES: Hammertoe deformities of 2, 3 and 4, left foot.   POSTOPERATIVE DIAGNOSES: Hammertoe deformities of 2, 3 and 4, left foot.    PROCEDURE: Hammertoe repairs 2, 3 and 4, left foot. Hammer lock implant size small and angled was used for the second and third toes and a straight arthroplasty was used for the fourth toe.   SURGEON: Epimenio SarinMatthew G Jaevian Shean, DPM.   ASSISTANT: None.   HISTORY OF PRESENT ILLNESS: The patient has had pain and discomfort to the left second, third, and fourth toes for a number of years. Conservative treatment has proven ineffective. She desires surgical correction on these areas.   ANESTHESIA: LMA with local.   ANESTHESIOLOGIST: Dr. Pernell DupreAdams.   ESTIMATED BLOOD LOSS: Negligible.   HEMOSTASIS: Ankle tourniquet 250 mmHg pressure.   DESCRIPTION OF PROCEDURE: The patient was brought to the OR and placed on the OR table in the supine position. At this point, after general anesthesia was achieved and local anesthesia was achieved, the patient was then prepped and draped in the usual sterile manner. At this time, attention was directed to the second toe of the left foot, where 2 semielliptical incisions were made over the PIP joint. This ellipse was removed. The extensor tendon was identified, incised transversely and reflected proximally. The extensor tendon over the middle phalanx was reflected distally. The articular cartilage was removed off of both of these regions. At this point, the canals were prepared for proximal phalanx and middle phalanx for the hammerlock implant. Once these were appropriately reamed, the hammer lock implant was taken from the freezer, implanted to both sides and the area was compressed and held for 1 minute before release. At this point, the toe was seen to sit in excellent position. FluoroScan showed good position of the implant  as well. The area was then irrigated. The extensor tendon was then reapproximated with 3 simple sutures interrupted sutures centrally, medially and laterally. Skin was then closed with 5-0 nylon horizontal mattress in simple interrupted combination.   At this time, attention was directed to the third toe of the left foot where a similar procedure was performed. At this time, attention was directed to the fourth toe of the  left foot where a similar procedure was performed, except the head of the proximal phalanx was removed and the middle phalanx was left alone. No implant was needed on this one. Irrigation took place on all these and closure was similar.   At this time, following 0.5% Marcaine block to each toe, a sterile compressive dressing was placed on each area, consisting of Xeroform gauze, 4 x 4's, Kling and Kerlix. The tourniquet was released. Prompt and complete vascularity was seen to return to all digits of the left foot. The patient has a posterior splint placed on the left foot and leg in the OR. The patient appeared to tolerate the procedure and anesthesia well and left the OR for the recovery room with vital signs stable and neurovascular status intact.    ____________________________ Rhona RaiderMatthew G. Jeannine Pennisi, DPM mgt:aw D: 02/12/2013 08:42:38 ET T: 02/12/2013 09:08:39 ET JOB#: [id number]  cc: Rhona RaiderMatthew G. Lillyahna Hemberger, DPM, <Dictator> Epimenio SarinMATTHEW G Garrin Kirwan MD ELECTRONICALLY SIGNED 03/23/2013 16:55
